# Patient Record
Sex: Female | Born: 1970 | Race: White | Hispanic: No | Marital: Married | Smoking: Never smoker
Health system: Southern US, Community
[De-identification: ages and names within clinical notes are randomized; demographics above are authoritative.]

## PROBLEM LIST (undated history)

## (undated) DIAGNOSIS — Z8489 Family history of other specified conditions: Secondary | ICD-10-CM

## (undated) DIAGNOSIS — Z87442 Personal history of urinary calculi: Secondary | ICD-10-CM

## (undated) DIAGNOSIS — I10 Essential (primary) hypertension: Secondary | ICD-10-CM

## (undated) HISTORY — PX: ENDOMETRIAL ABLATION W/ NOVASURE: SUR434

---

## 2001-01-14 ENCOUNTER — Other Ambulatory Visit: Admission: RE | Admit: 2001-01-14 | Discharge: 2001-01-14 | Payer: Self-pay | Admitting: *Deleted

## 2001-10-04 ENCOUNTER — Ambulatory Visit (HOSPITAL_COMMUNITY): Admission: RE | Admit: 2001-10-04 | Discharge: 2001-10-04 | Payer: Self-pay | Admitting: General Surgery

## 2015-05-02 ENCOUNTER — Other Ambulatory Visit: Payer: Self-pay | Admitting: Obstetrics and Gynecology

## 2015-05-02 DIAGNOSIS — R928 Other abnormal and inconclusive findings on diagnostic imaging of breast: Secondary | ICD-10-CM

## 2015-05-10 ENCOUNTER — Ambulatory Visit
Admission: RE | Admit: 2015-05-10 | Discharge: 2015-05-10 | Disposition: A | Discharge status: Home / Self Care | Payer: BC Managed Care – PPO | Source: Ambulatory Visit | Attending: Obstetrics and Gynecology | Admitting: Obstetrics and Gynecology

## 2015-05-10 DIAGNOSIS — R928 Other abnormal and inconclusive findings on diagnostic imaging of breast: Secondary | ICD-10-CM

## 2017-07-16 ENCOUNTER — Emergency Department (HOSPITAL_COMMUNITY): Payer: BC Managed Care – PPO

## 2017-07-16 ENCOUNTER — Encounter (HOSPITAL_COMMUNITY): Payer: Self-pay | Admitting: *Deleted

## 2017-07-16 ENCOUNTER — Other Ambulatory Visit: Payer: Self-pay

## 2017-07-16 ENCOUNTER — Emergency Department (HOSPITAL_COMMUNITY)
Admission: EM | Admit: 2017-07-16 | Discharge: 2017-07-17 | Disposition: A | Discharge status: Home / Self Care | Payer: BC Managed Care – PPO | Attending: Emergency Medicine | Admitting: Emergency Medicine

## 2017-07-16 DIAGNOSIS — K8020 Calculus of gallbladder without cholecystitis without obstruction: Secondary | ICD-10-CM | POA: Diagnosis not present

## 2017-07-16 DIAGNOSIS — M549 Dorsalgia, unspecified: Secondary | ICD-10-CM | POA: Insufficient documentation

## 2017-07-16 LAB — CBC
HEMATOCRIT: 39 % (ref 36.0–46.0)
Hemoglobin: 12.5 g/dL (ref 12.0–15.0)
MCH: 29.3 pg (ref 26.0–34.0)
MCHC: 32.1 g/dL (ref 30.0–36.0)
MCV: 91.3 fL (ref 78.0–100.0)
PLATELETS: 322 10*3/uL (ref 150–400)
RBC: 4.27 MIL/uL (ref 3.87–5.11)
RDW: 13.2 % (ref 11.5–15.5)
WBC: 17.8 10*3/uL — AB (ref 4.0–10.5)

## 2017-07-16 MED ORDER — GI COCKTAIL ~~LOC~~
30.0000 mL | Freq: Once | ORAL | Status: AC
  Administered 2017-07-16: 30 mL via ORAL
  Filled 2017-07-16: qty 30

## 2017-07-16 NOTE — ED Provider Notes (Signed)
[facility]Y [facility]PART[facility] Provider Note   CSN: [id number] Arrival date & time: 07/16/17  2203     History   Chief Complaint Chief Complaint  Patient presents with  . Back Pain    HPI Amy Dickerson is a 46 y.o. female.  Patient presents with sudden onset back pain onset while she was resting about 8:30 PM.  She reports pain across her entire upper back between her shoulder blades that lasted for about 30 minutes and then resolved.  After 30 minutes the pain returned and lasted for about another 30 minutes and radiated to her chest.  She is now pain-free.  She did not take anything for it.  Pain was associated with shortness of breath.  No abdominal pain, vomiting, fever, cough.  She now feels like she is breathing normally and feels back to baseline.  Denies any cardiac problems or lung problems.  Patient had this similar pain when she was pregnant about 11 years ago that was attributed to her gallbladder she has not had any symptoms since.  She denies any abdominal pain or pain with eating.  Denies any leg pain or leg swelling.  No recent long car trips or plane trips.  No history of blood clots.   The history is provided by the patient.    History reviewed. No pertinent past medical history.  There are no active problems to display for this patient.   Past Surgical History:  Procedure Laterality Date  . CESAREAN SECTION     x 2    OB History    No data available       Home Medications    Prior to Admission medications   Not on File    Family History History reviewed. No pertinent family history.  Social History Social History   Tobacco Use  . Smoking status: Never Smoker  . Smokeless tobacco: Never Used  Substance Use Topics  . Alcohol use: Yes    Frequency: Never    Comment: occasionally  . Drug use: No     Allergies   Patient has no known allergies.   Review of Systems Review of Systems  Constitutional: Negative for activity  change, appetite change and fever.  HENT: Negative for congestion and rhinorrhea.   Respiratory: Positive for chest tightness and shortness of breath.   Cardiovascular: Positive for chest pain.  Gastrointestinal: Negative for abdominal pain, nausea and vomiting.  Genitourinary: Negative for dysuria, hematuria, vaginal bleeding and vaginal discharge.  Musculoskeletal: Positive for back pain. Negative for arthralgias and myalgias.  Skin: Negative for rash.  Neurological: Negative for dizziness, weakness and light-headedness.   all other systems are negative except as noted in the HPI and PMH.     Physical Exam Updated Vital Signs BP (!) 145/92 (BP Location: Left Arm)   Pulse 100   Temp 98.6 F (37 C) (Oral)   Resp 19   Ht 5\' 5"  (1.651 m)   Wt 93.9 kg (207 lb)   LMP 07/08/2017   SpO2 98%   BMI 34.45 kg/m   Physical Exam  Constitutional: She is oriented to person, place, and time. She appears well-developed and well-nourished. No distress.  HENT:  Head: Normocephalic and atraumatic.  Mouth/Throat: Oropharynx is clear and moist. No oropharyngeal exudate.  Eyes: Conjunctivae and EOM are normal. Pupils are equal, round, and reactive to light.  Neck: Normal range of motion. Neck supple.  No meningismus.  Cardiovascular: Normal rate, regular rhythm, normal heart sounds and intact  distal pulses.  No murmur heard. Intact radial pulses and grip strengths  Pulmonary/Chest: Effort normal and breath sounds normal. No respiratory distress. She exhibits no tenderness.  Abdominal: Soft. There is no tenderness. There is no rebound and no guarding.  No right upper quadrant tenderness  Musculoskeletal: Normal range of motion. She exhibits no edema or tenderness.  The upper back appears normal and is nontender to palpation  Neurological: She is alert and oriented to person, place, and time. No cranial nerve deficit. She exhibits normal muscle tone. Coordination normal.   5/5 strength  throughout. CN 2-12 intact.Equal grip strength.   Skin: Skin is warm.  Psychiatric: She has a normal mood and affect. Her behavior is normal.  Nursing note and vitals reviewed.    ED Treatments / Results  Labs (all labs ordered are listed, but only abnormal results are displayed) Labs Reviewed  BASIC METABOLIC PANEL - Abnormal; Notable for the following components:      Result Value   Glucose, Bld 211 (*)    Creatinine, Ser 1.20 (*)    GFR calc non Af Amer 53 (*)    All other components within normal limits  CBC - Abnormal; Notable for the following components:   WBC 17.8 (*)    All other components within normal limits  D-DIMER, QUANTITATIVE (NOT AT [facility]) - Abnormal; Notable for the following components:   D-Dimer, Quant 0.54 (*)    All other components within normal limits  HEPATIC FUNCTION PANEL - Abnormal; Notable for the following components:   AST 100 (*)    All other components within normal limits  LIPASE, BLOOD  I-STAT TROPONIN, ED    EKG  EKG Interpretation  Date/Time:  Friday July 16 2017 22:37:12 EST Ventricular Rate:  84 PR Interval:  146 QRS Duration: 94 QT Interval:  392 QTC Calculation: 463 R Axis:   65 Text Interpretation:  Normal sinus rhythm Normal ECG No previous ECGs available Confirmed by Glynn Octave [phone]) on 07/16/2017 10:53:38 PM       Radiology Dg Chest 2 View  Result Date: 07/16/2017 CLINICAL DATA:  Upper back pain radiating to the chest. EXAM: CHEST  2 VIEW COMPARISON:  None. FINDINGS: The heart size and mediastinal contours are within normal limits. Both lungs are clear. The visualized skeletal structures are unremarkable. IMPRESSION: No active cardiopulmonary disease. Electronically Signed   By: Burman Nieves M.D.   On: 07/16/2017 23:28   Ct Angio Chest/abd/pel For Dissection W And/or Wo Contrast  Addendum Date: 07/17/2017   ADDENDUM REPORT: 07/17/2017 02:43 ADDENDUM: The timing of image acquisition was optimized for  assessment of the aorta rather than pulmonary arteries. There is no central pulmonary embolus or embolus within the lobar arteries. The appearance of the proximal segmental arteries is normal, but there is limited visualization of the distal segmental branches. There are no secondary findings that would suggest pulmonary embolus. Electronically Signed   By: Deatra Robinson M.D.   On: 07/17/2017 02:43   Result Date: 07/17/2017 CLINICAL DATA:  Upper back pain radiating to the chest EXAM: CT ANGIOGRAPHY CHEST, ABDOMEN AND PELVIS TECHNIQUE: Multidetector CT imaging through the chest, abdomen and pelvis was performed using the standard protocol during bolus administration of intravenous contrast. Multiplanar reconstructed images and MIPs were obtained and reviewed to evaluate the vascular anatomy. CONTRAST:  ISOVUE-370 IOPAMIDOL (ISOVUE-370) INJECTION 76% COMPARISON:  Chest radiograph 07/16/2017 FINDINGS: CTA CHEST FINDINGS Cardiovascular: Heart size is normal. There is no pericardial effusion. The course and caliber  of the thoracic aorta are normal. There is no atherosclerotic calcification. Precontrast imaging shows no intramural hematoma. There is no dissection or focal ulceration. There is a normal 3 vessel branching pattern of the aortic arch. The proximal arch vessels are widely patent. Mediastinum/Nodes: No mediastinal, hilar or axillary lymphadenopathy. The visualized thyroid and thoracic esophageal course are unremarkable. Lungs/Pleura: No pulmonary nodules or masses. No pleural effusion or pneumothorax. No focal airspace consolidation. No focal pleural abnormality. Musculoskeletal: No chest wall abnormality. No acute osseous abnormality. Review of the MIP images confirms the above findings. CTA ABDOMEN AND PELVIS FINDINGS VASCULAR Aorta: Normal caliber aorta without aneurysm, dissection, vasculitis or hemodynamically significant stenosis. There is calcific aortic atherosclerosis. Celiac: No aneurysm,  dissection or hemodynamically significant stenosis. Normal branching pattern. SMA: Widely patent without dissection or stenosis. Renals: Single renal arteries bilaterally. No aneurysm, dissection, stenosis or evidence of fibromuscular dysplasia. IMA: Patent without abnormality. Inflow: Minimal atherosclerotic calcification without any stenosis or other abnormality. Veins: Normal course and caliber of the major veins. Assessment is otherwise limited by the arterial dominant contrast phase. Review of the MIP images confirms the above findings. NON-VASCULAR Lower chest: No pulmonary nodules or pleural effusion. No visible pericardial effusion. Hepatobiliary: Normal hepatic contours and density. No visible biliary dilatation. Moderate gallbladder wall thickening versus large noncalcified gallstone. Pancreas: Normal contours without ductal dilatation. No peripancreatic fluid collection. Spleen: Normal arterial phase appearance of the spleen. Adrenals/Urinary Tract: --Adrenal glands: Normal. --Right kidney/ureter: No hydronephrosis or perinephric stranding. No nephrolithiasis. No obstructing ureteral stones. --Left kidney/ureter: No hydronephrosis or perinephric stranding. No nephrolithiasis. No obstructing ureteral stones. --Urinary bladder: Unremarkable. Stomach/Bowel: --Stomach/Duodenum: No hiatal hernia or other gastric abnormality. Normal duodenal course and caliber. --Small bowel: No dilatation or inflammation. --Colon: No focal abnormality. --Appendix: Normal. Lymphatic:  No abdominal or pelvic lymphadenopathy. Reproductive: Normal uterus and ovaries. Musculoskeletal. No bony spinal canal stenosis or focal osseous abnormality. Other: None. Review of the MIP images confirms the above findings. IMPRESSION: 1. No acute aortic syndrome. 2. Moderate gallbladder wall thickening versus large noncalcified gallstone. Right upper quadrant ultrasound is recommended. 3. Very mild calcific aortic atherosclerosis (ICD10-I70.0).  Electronically Signed: By: Deatra RobinsonKevin  Herman M.D. On: 07/17/2017 01:30    Procedures Procedures (including critical care time)  Medications Ordered in ED Medications  gi cocktail (Maalox,Lidocaine,Donnatal) (30 mLs Oral Given 07/16/17 2352)  iopamidol (ISOVUE-370) 76 % injection 100 mL (100 mLs Intravenous Contrast Given 07/17/17 0113)     Initial Impression / Assessment and Plan / ED Course  I have reviewed the triage vital signs and the nursing notes.  Pertinent labs & imaging results that were available during my care of the patient were reviewed by me and considered in my medical decision making (see chart for details).    Acute onset of upper back pain with shortness of breath, now resolved.  Patient is in no distress.  Breath sounds are equal.  EKG is nonischemic.  Low suspicion for ACS or aortic dissection.  Will obtain chest x-ray, labs including d-dimer to rule out pulmonary embolism.  Will evaluate gallbladder with LFTs and lipase.  Labs show nonspecific AST with leukocytosis.  Normal lipase.  Chest x-ray is negative.  Imaging negative for aortic dissection or pulmonary embolism. Initial troponin lost.  Second troponin at 3-hour mark is negative.  Low suspicion for ACS or pulmonary embolism.  CT shows thickened gallbladder wall and possible gallstone.  Patient with no right upper quadrant pain on exam. Ultrasound not available at this time tonight.  Upper  back pain has resolved.  We will arrange for outpatient ultrasound.  Patient to follow-up with surgery as needed for gallbladder pathology.  Return precautions discussed including worsening pain, fever, vomiting or any other concerns.  Final Clinical Impressions(s) / ED Diagnoses   Final diagnoses:  Upper back pain  Calculus of gallbladder without cholecystitis without obstruction    ED Discharge Orders    None       Geordie Nooney, Jeannett Senior, MD 07/17/17 [id number]

## 2017-07-16 NOTE — ED Triage Notes (Signed)
Pt c/o upper back pain that radiates around to her chest; pt states the pain started x 30 mins ago

## 2017-07-17 ENCOUNTER — Ambulatory Visit (HOSPITAL_COMMUNITY)
Admission: RE | Admit: 2017-07-17 | Discharge: 2017-07-17 | Disposition: A | Discharge status: Home / Self Care | Payer: BC Managed Care – PPO | Source: Ambulatory Visit | Attending: Emergency Medicine | Admitting: Emergency Medicine

## 2017-07-17 ENCOUNTER — Emergency Department (HOSPITAL_COMMUNITY): Payer: BC Managed Care – PPO

## 2017-07-17 LAB — BASIC METABOLIC PANEL
Anion gap: 10 (ref 5–15)
BUN: 16 mg/dL (ref 6–20)
CHLORIDE: 103 mmol/L (ref 101–111)
CO2: 23 mmol/L (ref 22–32)
CREATININE: 1.2 mg/dL — AB (ref 0.44–1.00)
Calcium: 9.5 mg/dL (ref 8.9–10.3)
GFR calc Af Amer: >60 mL/min (ref >60)
GFR calc non Af Amer: 53 mL/min — ABNORMAL LOW (ref >60)
Glucose, Bld: 211 mg/dL — ABNORMAL HIGH (ref 65–99)
Potassium: 3.5 mmol/L (ref 3.5–5.1)
Sodium: 136 mmol/L (ref 135–145)

## 2017-07-17 LAB — LIPASE, BLOOD: Lipase: 41 U/L (ref 11–51)

## 2017-07-17 LAB — HEPATIC FUNCTION PANEL
ALBUMIN: 4 g/dL (ref 3.5–5.0)
ALK PHOS: 112 U/L (ref 38–126)
ALT: 39 U/L (ref 14–54)
AST: 100 U/L — AB (ref 15–41)
BILIRUBIN TOTAL: 0.4 mg/dL (ref 0.3–1.2)
Bilirubin, Direct: 0.1 mg/dL (ref 0.1–0.5)
Indirect Bilirubin: 0.3 mg/dL (ref 0.3–0.9)
Total Protein: 7.9 g/dL (ref 6.5–8.1)

## 2017-07-17 LAB — I-STAT TROPONIN, ED: Troponin i, poc: 0 ng/mL (ref 0.00–0.08)

## 2017-07-17 LAB — D-DIMER, QUANTITATIVE: D-Dimer, Quant: 0.54 ug/mL-FEU — ABNORMAL HIGH (ref 0.00–0.50)

## 2017-07-17 MED ORDER — HYDROCODONE-ACETAMINOPHEN 5-325 MG PO TABS: 1.0000 | ORAL_TABLET | ORAL | 0 refills | Status: DC | PRN

## 2017-07-17 MED ORDER — ONDANSETRON 4 MG PO TBDP: 4.0000 mg | ORAL_TABLET | Freq: Three times a day (TID) | ORAL | 0 refills | Status: AC | PRN

## 2017-07-17 MED ORDER — IOPAMIDOL (ISOVUE-370) INJECTION 76%
100.0000 mL | Freq: Once | INTRAVENOUS | Status: AC | PRN
  Administered 2017-07-17: 100 mL via INTRAVENOUS

## 2017-07-17 NOTE — ED Notes (Signed)
Troponin 1 collected 07/16/17 1111 with sample processed by T Talbot.  Results not available.  Called lab to attempt add-on with lab reporting sample cannot be added on.  Troponin 1 redrawn and processed I-stat by Buzzy HanBrenda Norman.

## 2017-07-17 NOTE — ED Notes (Addendum)
AVS and prescriptions reviewed with verbalized understandings given by pt and pt spouse.  PIV removed with hemostasis achieved.

## 2017-07-17 NOTE — ED Provider Notes (Signed)
Patient aware of ultrasound findings, states that she feels better. She has appropriate follow-up information to contact our general surgeon in 2 days, and given her improved condition, this is appropriate.   Gerhard MunchLockwood, Josuel Koeppen, MD 07/17/17 1149

## 2017-07-17 NOTE — Discharge Instructions (Signed)
There is no evidence of heart attack or blood clot in the lung.  The CT scan shows a possible gallstone which may be the explanation for your upper back pain.  You should follow-up to have an ultrasound of your gallbladder by calling the number provided.  Return to the ED if you develop worsening pain, chest pain, trouble breathing, fever, vomiting or any other concerns.

## 2017-07-27 ENCOUNTER — Ambulatory Visit: Payer: BC Managed Care – PPO | Admitting: General Surgery

## 2017-07-27 ENCOUNTER — Encounter: Payer: Self-pay | Admitting: General Surgery

## 2017-07-27 VITALS — BP 141/93 | HR 95 | Temp 99.6°F | Resp 18 | Ht 65.0 in | Wt 205.0 lb

## 2017-07-27 DIAGNOSIS — K8020 Calculus of gallbladder without cholecystitis without obstruction: Secondary | ICD-10-CM | POA: Diagnosis not present

## 2017-07-27 NOTE — H&P (Signed)
[facility] History and Physical  Reason for Referral: Cholelithiasis  Referring Physician: Jake Samples, PA-C   Chief Complaint    Abdominal Pain      Amy Dickerson is a 46 y.o. female.  HPI: Ms. Amy Dickerson is a 46yo female with a history of back pain that caused her to go to the Ed where she was evaluated and found to have gallstones. She says that the pain started after eating Thanksgiving meal and leftovers. She reports having a similar episode maybe 11 years ago, and says that since then she has been relatively well.  She had some nausea but no vomiting. She did have a leukocytosis but report a cold at the same time as the gallbladder episode.  She denies any fevers or chills or continued RUQ pain. The pain improved on its own, and has not reoccurred in the last few days.   History reviewed. No pertinent past medical history.  Past Surgical History:  Procedure Laterality Date  . CESAREAN SECTION     x 2    Family History  Problem Relation Age of Onset  . Heart disease Mother   . Pulmonary fibrosis Mother   . Stroke Father   . Multiple myeloma Father     Social History   Tobacco Use  . Smoking status: Never Smoker  . Smokeless tobacco: Never Used  Substance Use Topics  . Alcohol use: Yes    Frequency: Never    Comment: occasionally  . Drug use: No    Medications: I have reviewed the patient's current medications. Prior to Admission:  (Not in a hospital admission) Scheduled: Continuous: PRN: Allergies as of 07/27/2017   No Known Allergies     Medication List        Accurate as of 07/27/17  9:55 AM. Always use your most recent med list.          HYDROcodone-acetaminophen 5-325 MG tablet Commonly known as:  NORCO/VICODIN Take 1 tablet by mouth every 4 (four) hours as needed.   ondansetron 4 MG disintegrating tablet Commonly known as:  ZOFRAN ODT Take 1 tablet (4 mg total) by mouth every 8 (eight) hours as needed for nausea  or vomiting.        ROS:  A comprehensive review of systems was negative except for: Gastrointestinal: positive for ocassional RUQ pain with food  Blood pressure (!) 141/93, pulse 95, temperature 99.6 F (37.6 C), resp. rate 18, height _0  (1.651 m), weight 205 lb (93 kg), last menstrual period 07/08/2017. Physical Exam  Constitutional: She is oriented to person, place, and time and well-developed, well-nourished, and in no distress.  HENT:  Head: Normocephalic.  Eyes: Pupils are equal, round, and reactive to light.  Neck: Normal range of motion.  Cardiovascular: Normal rate and regular rhythm.  Pulmonary/Chest: Effort normal and breath sounds normal.  Abdominal: Soft. She exhibits no distension. There is no tenderness. There is no rebound and no guarding.  Musculoskeletal: Normal range of motion. She exhibits no edema.  Neurological: She is alert and oriented to person, place, and time.  Skin: Skin is warm and dry.  Psychiatric: Mood, memory, affect and judgment normal.  Vitals reviewed.   Results: Personally reviewed imaging- contracted gallbladder with stone, large stone  CTA chest/ abd 11/24 - IMPRESSION: 1. No acute aortic syndrome. 2. Moderate gallbladder wall thickening versus large noncalcified gallstone. Right upper quadrant ultrasound is recommended. 3. Very mild calcific aortic atherosclerosis (ICD10-I70.0).  [place] RUQ 11/24- IMPRESSION: Cholelithiasis  with a large stone measuring up to 3.6 cm and sludge. No sonographic evidence for cholecystitis.  Assessment & Plan:  Amy Dickerson is a 46 y.o. female with cholelithiasis on imaging, and biliary colic symptoms. She did have a leukocytosis on the ED visit but normal LFTs.  Had a cough/ cold at the same time.  -OR for laparoscopic cholecystectomy, going out of town in the upcoming weeks and plan for 08/27/17   PLAN: I counseled the patient about the indication, risks and benefits of laparoscopic cholecystectomy.   She understands there is a very small chance for bleeding, infection, injury to normal structures (including common bile duct), conversion to open surgery, persistent symptoms, evolution of postcholecystectomy diarrhea, need for secondary interventions, anesthesia reaction, cardiopulmonary issues and other risks not specifically detailed here. I described the expected recovery, the plan for follow-up and the restrictions during the recovery phase.  All questions were answered.    Hart Haas C Mirna Sutcliffe 07/27/2017, 9:55 AM       

## 2017-07-27 NOTE — Patient Instructions (Signed)
Laparoscopic Cholecystectomy Laparoscopic cholecystectomy is surgery to remove the gallbladder. The gallbladder is a pear-shaped organ that lies beneath the liver on the right side of the body. The gallbladder stores bile, which is a fluid that helps the body to digest fats. Cholecystectomy is often done for inflammation of the gallbladder (cholecystitis). This condition is usually caused by a buildup of gallstones (cholelithiasis) in the gallbladder. Gallstones can block the flow of bile, which can result in inflammation and pain. In severe cases, emergency surgery may be required. This procedure is done though small incisions in your abdomen (laparoscopic surgery). A thin scope with a camera (laparoscope) is inserted through one incision. Thin surgical instruments are inserted through the other incisions. In some cases, a laparoscopic procedure may be turned into a type of surgery that is done through a larger incision (open surgery). Tell a health care provider about:  Any allergies you have.  All medicines you are taking, including vitamins, herbs, eye drops, creams, and over-the-counter medicines.  Any problems you or family members have had with anesthetic medicines.  Any blood disorders you have.  Any surgeries you have had.  Any medical conditions you have.  Whether you are pregnant or may be pregnant. What are the risks? Generally, this is a safe procedure. However, problems may occur, including:  Infection.  Bleeding.  Allergic reactions to medicines.  Damage to other structures or organs.  A stone remaining in the common bile duct. The common bile duct carries bile from the gallbladder into the small intestine.  A bile leak from the cyst duct that is clipped when your gallbladder is removed.  What happens before the procedure? Staying hydrated Follow instructions from your health care provider about hydration, which may include:  Up to 2 hours before the procedure -  you may continue to drink clear liquids, such as water, clear fruit juice, black coffee, and plain tea.  Eating and drinking restrictions Follow instructions from your health care provider about eating and drinking, which may include:  8 hours before the procedure - stop eating heavy meals or foods such as meat, fried foods, or fatty foods.  6 hours before the procedure - stop eating light meals or foods, such as toast or cereal.  6 hours before the procedure - stop drinking milk or drinks that contain milk.  2 hours before the procedure - stop drinking clear liquids.  Medicines  Ask your health care provider about: ? Changing or stopping your regular medicines. This is especially important if you are taking diabetes medicines or blood thinners. ? Taking medicines such as aspirin and ibuprofen. These medicines can thin your blood. Do not take these medicines before your procedure if your health care provider instructs you not to.  You may be given antibiotic medicine to help prevent infection. General instructions  Let your health care provider know if you develop a cold or an infection before surgery.  Plan to have someone take you home from the hospital or clinic.  Ask your health care provider how your surgical site will be marked or identified. What happens during the procedure?  To reduce your risk of infection: ? Your health care team will wash or sanitize their hands. ? Your skin will be washed with soap. ? Hair may be removed from the surgical area.  An IV tube may be inserted into one of your veins.  You will be given one or more of the following: ? A medicine to help you relax (sedative). ?   A medicine to make you fall asleep (general anesthetic).  A breathing tube will be placed in your mouth.  Your surgeon will make several small cuts (incisions) in your abdomen.  The laparoscope will be inserted through one of the small incisions. The camera on the laparoscope  will send images to a TV screen (monitor) in the operating room. This lets your surgeon see inside your abdomen.  Air-like gas will be pumped into your abdomen. This will expand your abdomen to give the surgeon more room to perform the surgery.  Other tools that are needed for the procedure will be inserted through the other incisions. The gallbladder will be removed through one of the incisions.  Your common bile duct may be examined. If stones are found in the common bile duct, they may be removed.  After your gallbladder has been removed, the incisions will be closed with stitches (sutures), staples, or skin glue.  Your incisions may be covered with a bandage (dressing). The procedure may vary among health care providers and hospitals. What happens after the procedure?  Your blood pressure, heart rate, breathing rate, and blood oxygen level will be monitored until the medicines you were given have worn off.  You will be given medicines as needed to control your pain.  Do not drive for 24 hours if you were given a sedative. This information is not intended to replace advice given to you by your health care provider. Make sure you discuss any questions you have with your health care provider. Document Released: 08/10/2005 Document Revised: 03/01/2016 Document Reviewed: 01/27/2016 Elsevier Interactive Patient Education  2018 [employer] Cholelithiasis Cholelithiasis is also called "gallstones." It is a kind of gallbladder disease. The gallbladder is an organ that stores a liquid (bile) that helps you digest fat. Gallstones may not cause symptoms (may be silent gallstones) until they cause a blockage, and then they can cause pain (gallbladder attack). Follow these instructions at home:  Take over-the-counter and prescription medicines only as told by your doctor.  Stay at a healthy weight.  Eat healthy foods. This includes: ? Eating fewer fatty foods, like fried foods. ? Eating  fewer refined carbs (refined carbohydrates). Refined carbs are breads and grains that are highly processed, like white bread and white rice. Instead, choose whole grains like whole-wheat bread and brown rice. ? Eating more fiber. Almonds, fresh fruit, and beans are healthy sources of fiber.  Keep all follow-up visits as told by your doctor. This is important. Contact a doctor if:  You have sudden pain in the upper right side of your belly (abdomen). Pain might spread to your right shoulder or your chest. This may be a sign of a gallbladder attack.  You feel sick to your stomach (are nauseous).  You throw up (vomit).  You have been diagnosed with gallstones that have no symptoms and you get: ? Belly pain. ? Discomfort, burning, or fullness in the upper part of your belly (indigestion). Get help right away if:  You have sudden pain in the upper right side of your belly, and it lasts for more than 2 hours.  You have belly pain that lasts for more than 5 hours.  You have a fever or chills.  You keep feeling sick to your stomach or you keep throwing up.  Your skin or the whites of your eyes turn yellow (jaundice).  You have dark-colored pee (urine).  You have light-colored poop (stool). Summary  Cholelithiasis is also called "gallstones."  The gallbladder   is an organ that stores a liquid (bile) that helps you digest fat.  Silent gallstones are gallstones that do not cause symptoms.  A gallbladder attack may cause sudden pain in the upper right side of your belly. Pain might spread to your right shoulder or your chest. If this happens, contact your doctor.  If you have sudden pain in the upper right side of your belly that lasts for more than 2 hours, get help right away. This information is not intended to replace advice given to you by your health care provider. Make sure you discuss any questions you have with your health care provider. Document Released: 01/27/2008 Document  Revised: 04/26/2016 Document Reviewed: 04/26/2016 Elsevier Interactive Patient Education  2017 [employer]  

## 2017-07-27 NOTE — Progress Notes (Signed)
[facility] History and Physical  Reason for Referral: Cholelithiasis  Referring Physician: Jake Samples, PA-C   Chief Complaint    Abdominal Pain      Amy Dickerson is a 46 y.o. female.  HPI: Amy Dickerson is a 46yo female with a history of back pain that caused her to go to the Ed where she was evaluated and found to have gallstones. She says that the pain started after eating Thanksgiving meal and leftovers. She reports having a similar episode maybe 11 years ago, and says that since then she has been relatively well.  She had some nausea but no vomiting. She did have a leukocytosis but report a cold at the same time as the gallbladder episode.  She denies any fevers or chills or continued RUQ pain. The pain improved on its own, and has not reoccurred in the last few days.   History reviewed. No pertinent past medical history.  Past Surgical History:  Procedure Laterality Date  . CESAREAN SECTION     x 2    Family History  Problem Relation Age of Onset  . Heart disease Mother   . Pulmonary fibrosis Mother   . Stroke Father   . Multiple myeloma Father     Social History   Tobacco Use  . Smoking status: Never Smoker  . Smokeless tobacco: Never Used  Substance Use Topics  . Alcohol use: Yes    Frequency: Never    Comment: occasionally  . Drug use: No    Medications: I have reviewed the patient's current medications. Prior to Admission:  (Not in a hospital admission) Scheduled: Continuous: PRN: Allergies as of 07/27/2017   No Known Allergies     Medication List        Accurate as of 07/27/17  9:55 AM. Always use your most recent med list.          HYDROcodone-acetaminophen 5-325 MG tablet Commonly known as:  NORCO/VICODIN Take 1 tablet by mouth every 4 (four) hours as needed.   ondansetron 4 MG disintegrating tablet Commonly known as:  ZOFRAN ODT Take 1 tablet (4 mg total) by mouth every 8 (eight) hours as needed for nausea  or vomiting.        ROS:  A comprehensive review of systems was negative except for: Gastrointestinal: positive for ocassional RUQ pain with food  Blood pressure (!) 141/93, pulse 95, temperature 99.6 F (37.6 C), resp. rate 18, height _0  (1.651 m), weight 205 lb (93 kg), last menstrual period 07/08/2017. Physical Exam  Constitutional: She is oriented to person, place, and time and well-developed, well-nourished, and in no distress.  HENT:  Head: Normocephalic.  Eyes: Pupils are equal, round, and reactive to light.  Neck: Normal range of motion.  Cardiovascular: Normal rate and regular rhythm.  Pulmonary/Chest: Effort normal and breath sounds normal.  Abdominal: Soft. She exhibits no distension. There is no tenderness. There is no rebound and no guarding.  Musculoskeletal: Normal range of motion. She exhibits no edema.  Neurological: She is alert and oriented to person, place, and time.  Skin: Skin is warm and dry.  Psychiatric: Mood, memory, affect and judgment normal.  Vitals reviewed.   Results: Personally reviewed imaging- contracted gallbladder with stone, large stone  CTA chest/ abd 11/24 - IMPRESSION: 1. No acute aortic syndrome. 2. Moderate gallbladder wall thickening versus large noncalcified gallstone. Right upper quadrant ultrasound is recommended. 3. Very mild calcific aortic atherosclerosis (ICD10-I70.0).  [place] RUQ 11/24- IMPRESSION: Cholelithiasis  with a large stone measuring up to 3.6 cm and sludge. No sonographic evidence for cholecystitis.  Assessment & Plan:  Amy Dickerson is a 46 y.o. female with cholelithiasis on imaging, and biliary colic symptoms. She did have a leukocytosis on the ED visit but normal LFTs.  Had a cough/ cold at the same time.  -OR for laparoscopic cholecystectomy, going out of town in the upcoming weeks and plan for 08/27/17   PLAN: I counseled the patient about the indication, risks and benefits of laparoscopic cholecystectomy.   She understands there is a very small chance for bleeding, infection, injury to normal structures (including common bile duct), conversion to open surgery, persistent symptoms, evolution of postcholecystectomy diarrhea, need for secondary interventions, anesthesia reaction, cardiopulmonary issues and other risks not specifically detailed here. I described the expected recovery, the plan for follow-up and the restrictions during the recovery phase.  All questions were answered.    Virl Cagey 07/27/2017, 9:55 AM

## 2017-08-18 NOTE — Patient Instructions (Signed)
Amy DonningRebecca M Dickerson  08/18/2017     @[email or web address] @   Your procedure is scheduled on  08/27/2017   [facility] at  [facility]   [facility].M.  Call this number if you have problems the morning of surgery:  [phone]   Remember:  Do not eat food or drink liquids after midnight.  Take these medicines the morning of surgery with A SIP OF WATER  zofran.   Do not wear jewelry, make-up or nail polish.  Do not wear lotions, powders, or perfumes, or deodorant.  Do not shave 48 hours prior to surgery.  Men may shave face and neck.  Do not bring valuables to the hospital.  [facility] is not responsible for any belongings or valuables.  Contacts, dentures or bridgework may not be worn into surgery.  Leave your suitcase in the car.  After surgery it may be brought to your room.  For patients admitted to the hospital, discharge time will be determined by your treatment team.  Patients discharged the day of surgery will not be allowed to drive home.   Name and phone number of your driver:   family Special instructions:  None  Please read over the following fact sheets that you were given. Anesthesia Post-op Instructions and Care and Recovery After Surgery       Open Cholecystectomy Open cholecystectomy is surgery to remove the gallbladder. The gallbladder is a pear-shaped organ that lies beneath the liver on the right side of the body. The gallbladder stores bile, which is a fluid that helps the body to digest fats. Cholecystectomy is often done for inflammation of the gallbladder (cholecystitis). This condition is usually caused by a buildup of gallstones (cholelithiasis) in the gallbladder. Gallstones can block the flow of bile, which can result in inflammation and pain. In severe cases, emergency surgery may be required. Tell a health care provider about:  Any allergies you have.  All medicines you are taking, including vitamins, herbs, eye drops, creams, and  over-the-counter medicines.  Any problems you or family members have had with anesthetic medicines.  Any blood disorders you have.  Any surgeries you have had.  Any medical conditions you have.  Whether you are pregnant or may be pregnant. What are the risks? Generally, this is a safe procedure. However, problems may occur, including:  Infection.  Bleeding.  Allergic reactions to medicines.  Damage to other structures or organs.  A stone remaining in the common bile duct. The common bile duct carries bile from the gallbladder into the small intestine.  A bile leak from the cyst duct that is clipped when your gallbladder is removed.  What happens before the procedure? Staying hydrated Follow instructions from your health care provider about hydration, which may include:  Up to 2 hours before the procedure - you may continue to drink clear liquids, such as water, clear fruit juice, black coffee, and plain tea.  Eating and drinking restrictions Follow instructions from your health care provider about eating and drinking, which may include:  8 hours before the procedure - stop eating heavy meals or foods such as meat, fried foods, or fatty foods.  6 hours before the procedure - stop eating light meals or foods, such as toast or cereal.  6 hours before the procedure - stop drinking milk or drinks that contain milk.  2 hours before the procedure - stop drinking clear liquids.  Medicines  Ask your health care provider about: ? Changing or stopping your regular medicines. This is especially important if you are taking diabetes medicines or blood thinners. ? Taking medicines such as aspirin and ibuprofen. These medicines can thin your blood. Do not take these medicines before your procedure if your health care provider instructs you not to.  You may be given antibiotic medicine to help prevent infection. General instructions  Let your health care provider know if you  develop a cold or an infection before surgery.  Plan to have someone take you home from the hospital or clinic.  Ask your health care provider how your surgical site will be marked or identified. What happens during the procedure?  To reduce your risk of infection: ? Your health care team will wash or sanitize their hands. ? Your skin will be washed with soap. ? Hair may be removed from the surgical area.  An IV tube may be inserted into one of your veins.  You will be given one or more of the following: ? A medicine to help you relax (sedative). ? A medicine to make you fall asleep (general anesthetic).  A breathing tube will be placed in your mouth.  Your surgeon will make a cut (incision) in the upper abdomen to access your gallbladder.  Your gallbladder will be removed.  Your common bile duct may be examined. If stones are found in the common bile duct, they may be removed.  After your gallbladder has been removed, the incisions will be closed with stitches (sutures), skin glue, or staples.  Your incision will be covered with a bandage (dressing). The procedure may vary among health care providers and hospitals. What happens after the procedure?  Your blood pressure, heart rate, breathing rate, and blood oxygen level will be monitored until the medicines you were given have worn off.  You will be given medicines as needed to control your pain.  Do not drive for 24 hours if you were given a sedative. This information is not intended to replace advice given to you by your health care provider. Make sure you discuss any questions you have with your health care provider. Document Released: 05/02/2002 Document Revised: 02/29/2016 Document Reviewed: 01/27/2016 Elsevier Interactive Patient Education  2018 [employer].  Open Cholecystectomy, Care After This sheet gives you information about how to care for yourself after your procedure. Your health care provider may also give  you more specific instructions. If you have problems or questions, contact your health care provider. What can I expect after the procedure? After the procedure, it is common to have:  Pain at your incision site. You will be given medicines to control this pain.  Mild nausea or vomiting.  Follow these instructions at home: Incision care   Follow instructions from your health care provider about how to take care of your incision. Make sure you: ? Wash your hands with soap and water before you change your bandage (dressing). If soap and water are not available, use hand sanitizer. ? Change your dressing as told by your health care provider. ? Leave stitches (sutures), skin glue, or adhesive strips in place. These skin closures may need to be in place for 2 weeks or longer. If adhesive strip edges start to loosen and curl up, you may trim the loose edges. Do not remove adhesive strips completely unless your health care provider tells you to do that.  Do not take baths, swim, or use a hot tub until your health  care provider approves. Ask your health care provider if you can take showers. You may only be allowed to take sponge baths for bathing.  Check your incision area every day for signs of infection. Check for: ? More redness, swelling, or pain. ? More fluid or blood. ? Warmth. ? Pus or a bad smell. Activity  Do not drive or use heavy machinery while taking prescription pain medicine.  Do not lift anything that is heavier than 10 lb (4.5 kg) until your health care provider approves.  Do not play contact sports until your health care provider approves.  Do not drive for 24 hours if you were given a medicine to help you relax (sedative).  Rest as needed. Do not return to work or school until your health care provider approves. General instructions  Take over-the-counter and prescription medicines only as told by your health care provider.  To prevent or treat constipation while  you are taking prescription pain medicine, your health care provider may recommend that you: ? Drink enough fluid to keep your urine clear or pale yellow. ? Take over-the-counter or prescription medicines. ? Eat foods that are high in fiber, such as fresh fruits and vegetables, whole grains, and beans. ? Limit foods that are high in fat and processed sugars, such as fried and sweet foods. Contact a health care provider if:  You develop a rash.  You have more redness, swelling, or pain around your incision.  You have more fluid or blood coming from your incision.  Your incision feels warm to the touch.  You have pus or a bad smell coming from your incision.  You have a fever.  Your incision breaks open. Get help right away if:  You have trouble breathing.  You have chest pain.  You have increasing pain in your shoulders.  You faint or feel dizzy when you stand.  You have severe pain in your abdomen.  You have nausea or vomiting that lasts for more than one day.  You have leg pain. This information is not intended to replace advice given to you by your health care provider. Make sure you discuss any questions you have with your health care provider. Document Released: 11/26/2003 Document Revised: 02/29/2016 Document Reviewed: 01/27/2016 Elsevier Interactive Patient Education  2018 [employer].  Laparoscopic Cholecystectomy Laparoscopic cholecystectomy is surgery to remove the gallbladder. The gallbladder is a pear-shaped organ that lies beneath the liver on the right side of the body. The gallbladder stores bile, which is a fluid that helps the body to digest fats. Cholecystectomy is often done for inflammation of the gallbladder (cholecystitis). This condition is usually caused by a buildup of gallstones (cholelithiasis) in the gallbladder. Gallstones can block the flow of bile, which can result in inflammation and pain. In severe cases, emergency surgery may be  required. This procedure is done though small incisions in your abdomen (laparoscopic surgery). A thin scope with a camera (laparoscope) is inserted through one incision. Thin surgical instruments are inserted through the other incisions. In some cases, a laparoscopic procedure may be turned into a type of surgery that is done through a larger incision (open surgery). Tell a health care provider about:  Any allergies you have.  All medicines you are taking, including vitamins, herbs, eye drops, creams, and over-the-counter medicines.  Any problems you or family members have had with anesthetic medicines.  Any blood disorders you have.  Any surgeries you have had.  Any medical conditions you have.  Whether you  are pregnant or may be pregnant. What are the risks? Generally, this is a safe procedure. However, problems may occur, including:  Infection.  Bleeding.  Allergic reactions to medicines.  Damage to other structures or organs.  A stone remaining in the common bile duct. The common bile duct carries bile from the gallbladder into the small intestine.  A bile leak from the cyst duct that is clipped when your gallbladder is removed.  What happens before the procedure? Staying hydrated Follow instructions from your health care provider about hydration, which may include:  Up to 2 hours before the procedure - you may continue to drink clear liquids, such as water, clear fruit juice, black coffee, and plain tea.  Eating and drinking restrictions Follow instructions from your health care provider about eating and drinking, which may include:  8 hours before the procedure - stop eating heavy meals or foods such as meat, fried foods, or fatty foods.  6 hours before the procedure - stop eating light meals or foods, such as toast or cereal.  6 hours before the procedure - stop drinking milk or drinks that contain milk.  2 hours before the procedure - stop drinking clear  liquids.  Medicines  Ask your health care provider about: ? Changing or stopping your regular medicines. This is especially important if you are taking diabetes medicines or blood thinners. ? Taking medicines such as aspirin and ibuprofen. These medicines can thin your blood. Do not take these medicines before your procedure if your health care provider instructs you not to.  You may be given antibiotic medicine to help prevent infection. General instructions  Let your health care provider know if you develop a cold or an infection before surgery.  Plan to have someone take you home from the hospital or clinic.  Ask your health care provider how your surgical site will be marked or identified. What happens during the procedure?  To reduce your risk of infection: ? Your health care team will wash or sanitize their hands. ? Your skin will be washed with soap. ? Hair may be removed from the surgical area.  An IV tube may be inserted into one of your veins.  You will be given one or more of the following: ? A medicine to help you relax (sedative). ? A medicine to make you fall asleep (general anesthetic).  A breathing tube will be placed in your mouth.  Your surgeon will make several small cuts (incisions) in your abdomen.  The laparoscope will be inserted through one of the small incisions. The camera on the laparoscope will send images to a TV screen (monitor) in the operating room. This lets your surgeon see inside your abdomen.  Air-like gas will be pumped into your abdomen. This will expand your abdomen to give the surgeon more room to perform the surgery.  Other tools that are needed for the procedure will be inserted through the other incisions. The gallbladder will be removed through one of the incisions.  Your common bile duct may be examined. If stones are found in the common bile duct, they may be removed.  After your gallbladder has been removed, the incisions will be  closed with stitches (sutures), staples, or skin glue.  Your incisions may be covered with a bandage (dressing). The procedure may vary among health care providers and hospitals. What happens after the procedure?  Your blood pressure, heart rate, breathing rate, and blood oxygen level will be monitored until the medicines you  were given have worn off.  You will be given medicines as needed to control your pain.  Do not drive for 24 hours if you were given a sedative. This information is not intended to replace advice given to you by your health care provider. Make sure you discuss any questions you have with your health care provider. Document Released: 08/10/2005 Document Revised: 03/01/2016 Document Reviewed: 01/27/2016 Elsevier Interactive Patient Education  2018 [employer].  Laparoscopic Cholecystectomy, Care After This sheet gives you information about how to care for yourself after your procedure. Your doctor may also give you more specific instructions. If you have problems or questions, contact your doctor. Follow these instructions at home: Care for cuts from surgery (incisions)   Follow instructions from your doctor about how to take care of your cuts from surgery. Make sure you: ? Wash your hands with soap and water before you change your bandage (dressing). If you cannot use soap and water, use hand sanitizer. ? Change your bandage as told by your doctor. ? Leave stitches (sutures), skin glue, or skin tape (adhesive) strips in place. They may need to stay in place for 2 weeks or longer. If tape strips get loose and curl up, you may trim the loose edges. Do not remove tape strips completely unless your doctor says it is okay.  Do not take baths, swim, or use a hot tub until your doctor says it is okay. Ask your doctor if you can take showers. You may only be allowed to take sponge baths for bathing.  Check your surgical cut area every day for signs of infection. Check  for: ? More redness, swelling, or pain. ? More fluid or blood. ? Warmth. ? Pus or a bad smell. Activity  Do not drive or use heavy machinery while taking prescription pain medicine.  Do not lift anything that is heavier than 10 lb (4.5 kg) until your doctor says it is okay.  Do not play contact sports until your doctor says it is okay.  Do not drive for 24 hours if you were given a medicine to help you relax (sedative).  Rest as needed. Do not return to work or school until your doctor says it is okay. General instructions  Take over-the-counter and prescription medicines only as told by your doctor.  To prevent or treat constipation while you are taking prescription pain medicine, your doctor may recommend that you: ? Drink enough fluid to keep your pee (urine) clear or pale yellow. ? Take over-the-counter or prescription medicines. ? Eat foods that are high in fiber, such as fresh fruits and vegetables, whole grains, and beans. ? Limit foods that are high in fat and processed sugars, such as fried and sweet foods. Contact a doctor if:  You develop a rash.  You have more redness, swelling, or pain around your surgical cuts.  You have more fluid or blood coming from your surgical cuts.  Your surgical cuts feel warm to the touch.  You have pus or a bad smell coming from your surgical cuts.  You have a fever.  One or more of your surgical cuts breaks open. Get help right away if:  You have trouble breathing.  You have chest pain.  You have pain that is getting worse in your shoulders.  You faint or feel dizzy when you stand.  You have very bad pain in your belly (abdomen).  You are sick to your stomach (nauseous) for more than one day.  You  have throwing up (vomiting) that lasts for more than one day.  You have leg pain. This information is not intended to replace advice given to you by your health care provider. Make sure you discuss any questions you have with  your health care provider. Document Released: 05/19/2008 Document Revised: 02/29/2016 Document Reviewed: 01/27/2016 Elsevier Interactive Patient Education  2018 [employer].  General Anesthesia, Adult General anesthesia is the use of medicines to make a person "go to sleep" (be unconscious) for a medical procedure. General anesthesia is often recommended when a procedure:  Is long.  Requires you to be still or in an unusual position.  Is major and can cause you to lose blood.  Is impossible to do without general anesthesia.  The medicines used for general anesthesia are called general anesthetics. In addition to making you sleep, the medicines:  Prevent pain.  Control your blood pressure.  Relax your muscles.  Tell a health care provider about:  Any allergies you have.  All medicines you are taking, including vitamins, herbs, eye drops, creams, and over-the-counter medicines.  Any problems you or family members have had with anesthetic medicines.  Types of anesthetics you have had in the past.  Any bleeding disorders you have.  Any surgeries you have had.  Any medical conditions you have.  Any history of heart or lung conditions, such as heart failure, sleep apnea, or chronic obstructive pulmonary disease (COPD).  Whether you are pregnant or may be pregnant.  Whether you use tobacco, alcohol, marijuana, or street drugs.  Any history of Financial planner.  Any history of depression or anxiety. What are the risks? Generally, this is a safe procedure. However, problems may occur, including:  Allergic reaction to anesthetics.  Lung and heart problems.  Inhaling food or liquids from your stomach into your lungs (aspiration).  Injury to nerves.  Waking up during your procedure and being unable to move (rare).  Extreme agitation or a state of mental confusion (delirium) when you wake up from the anesthetic.  Air in the bloodstream, which can lead to  stroke.  These problems are more likely to develop if you are having a major surgery or if you have an advanced medical condition. You can prevent some of these complications by answering all of your health care provider's questions thoroughly and by following all pre-procedure instructions. General anesthesia can cause side effects, including:  Nausea or vomiting  A sore throat from the breathing tube.  Feeling cold or shivery.  Feeling tired, washed out, or achy.  Sleepiness or drowsiness.  Confusion or agitation.  What happens before the procedure? Staying hydrated Follow instructions from your health care provider about hydration, which may include:  Up to 2 hours before the procedure - you may continue to drink clear liquids, such as water, clear fruit juice, black coffee, and plain tea.  Eating and drinking restrictions Follow instructions from your health care provider about eating and drinking, which may include:  8 hours before the procedure - stop eating heavy meals or foods such as meat, fried foods, or fatty foods.  6 hours before the procedure - stop eating light meals or foods, such as toast or cereal.  6 hours before the procedure - stop drinking milk or drinks that contain milk.  2 hours before the procedure - stop drinking clear liquids.  Medicines  Ask your health care provider about: ? Changing or stopping your regular medicines. This is especially important if you are taking diabetes medicines  or blood thinners. ? Taking medicines such as aspirin and ibuprofen. These medicines can thin your blood. Do not take these medicines before your procedure if your health care provider instructs you not to. ? Taking new dietary supplements or medicines. Do not take these during the week before your procedure unless your health care provider approves them.  If you are told to take a medicine or to continue taking a medicine on the day of the procedure, take the  medicine with sips of water. General instructions   Ask if you will be going home the same day, the following day, or after a longer hospital stay. ? Plan to have someone take you home. ? Plan to have someone stay with you for the first 24 hours after you leave the hospital or clinic.  For 3-6 weeks before the procedure, try not to use any tobacco products, such as cigarettes, chewing tobacco, and e-cigarettes.  You may brush your teeth on the morning of the procedure, but make sure to spit out the toothpaste. What happens during the procedure?  You will be given anesthetics through a mask and through an IV tube in one of your veins.  You may receive medicine to help you relax (sedative).  As soon as you are asleep, a breathing tube may be used to help you breathe.  An anesthesia specialist will stay with you throughout the procedure. He or she will help keep you comfortable and safe by continuing to give you medicines and adjusting the amount of medicine that you get. He or she will also watch your blood pressure, pulse, and oxygen levels to make sure that the anesthetics do not cause any problems.  If a breathing tube was used to help you breathe, it will be removed before you wake up. The procedure may vary among health care providers and hospitals. What happens after the procedure?  You will wake up, often slowly, after the procedure is complete, usually in a recovery area.  Your blood pressure, heart rate, breathing rate, and blood oxygen level will be monitored until the medicines you were given have worn off.  You may be given medicine to help you calm down if you feel anxious or agitated.  If you will be going home the same day, your health care provider may check to make sure you can stand, drink, and urinate.  Your health care providers will treat your pain and side effects before you go home.  Do not drive for 24 hours if you received a sedative.  You may: ? Feel  nauseous and vomit. ? Have a sore throat. ? Have mental slowness. ? Feel cold or shivery. ? Feel sleepy. ? Feel tired. ? Feel sore or achy, even in parts of your body where you did not have surgery. This information is not intended to replace advice given to you by your health care provider. Make sure you discuss any questions you have with your health care provider. Document Released: 11/17/2007 Document Revised: 01/21/2016 Document Reviewed: 07/25/2015 Elsevier Interactive Patient Education  2018 [employer]. General Anesthesia, Adult, Care After These instructions provide you with information about caring for yourself after your procedure. Your health care provider may also give you more specific instructions. Your treatment has been planned according to current medical practices, but problems sometimes occur. Call your health care provider if you have any problems or questions after your procedure. What can I expect after the procedure? After the procedure, it is common to have:  Vomiting.  A sore throat.  Mental slowness.  It is common to feel:  Nauseous.  Cold or shivery.  Sleepy.  Tired.  Sore or achy, even in parts of your body where you did not have surgery.  Follow these instructions at home: For at least 24 hours after the procedure:  Do not: ? Participate in activities where you could fall or become injured. ? Drive. ? Use heavy machinery. ? Drink alcohol. ? Take sleeping pills or medicines that cause drowsiness. ? Make important decisions or sign legal documents. ? Take care of children on your own.  Rest. Eating and drinking  If you vomit, drink water, juice, or soup when you can drink without vomiting.  Drink enough fluid to keep your urine clear or pale yellow.  Make sure you have little or no nausea before eating solid foods.  Follow the diet recommended by your health care provider. General instructions  Have a responsible adult stay with  you until you are awake and alert.  Return to your normal activities as told by your health care provider. Ask your health care provider what activities are safe for you.  Take over-the-counter and prescription medicines only as told by your health care provider.  If you smoke, do not smoke without supervision.  Keep all follow-up visits as told by your health care provider. This is important. Contact a health care provider if:  You continue to have nausea or vomiting at home, and medicines are not helpful.  You cannot drink fluids or start eating again.  You cannot urinate after 8-12 hours.  You develop a skin rash.  You have fever.  You have increasing redness at the site of your procedure. Get help right away if:  You have difficulty breathing.  You have chest pain.  You have unexpected bleeding.  You feel that you are having a life-threatening or urgent problem. This information is not intended to replace advice given to you by your health care provider. Make sure you discuss any questions you have with your health care provider. Document Released: 11/16/2000 Document Revised: 01/13/2016 Document Reviewed: 07/25/2015 Elsevier Interactive Patient Education  [employer]2018 Elsevier Inc.

## 2017-08-23 ENCOUNTER — Encounter (HOSPITAL_COMMUNITY): Payer: Self-pay

## 2017-08-23 ENCOUNTER — Other Ambulatory Visit: Payer: Self-pay

## 2017-08-23 ENCOUNTER — Encounter (HOSPITAL_COMMUNITY)
Admission: RE | Admit: 2017-08-23 | Discharge: 2017-08-23 | Disposition: A | Discharge status: Home / Self Care | Payer: BC Managed Care – PPO | Source: Ambulatory Visit | Attending: General Surgery | Admitting: General Surgery

## 2017-08-23 DIAGNOSIS — Z01812 Encounter for preprocedural laboratory examination: Secondary | ICD-10-CM | POA: Diagnosis present

## 2017-08-23 DIAGNOSIS — Z8249 Family history of ischemic heart disease and other diseases of the circulatory system: Secondary | ICD-10-CM | POA: Insufficient documentation

## 2017-08-23 DIAGNOSIS — Z87442 Personal history of urinary calculi: Secondary | ICD-10-CM | POA: Diagnosis not present

## 2017-08-23 HISTORY — DX: Family history of other specified conditions: Z84.89

## 2017-08-23 HISTORY — DX: Personal history of urinary calculi: Z87.442

## 2017-08-23 LAB — CBC WITH DIFFERENTIAL/PLATELET
Basophils Absolute: 0 10*3/uL (ref 0.0–0.1)
Basophils Relative: 0 %
EOS ABS: 0.1 10*3/uL (ref 0.0–0.7)
Eosinophils Relative: 1 %
HCT: 38.7 % (ref 36.0–46.0)
HEMOGLOBIN: 12.5 g/dL (ref 12.0–15.0)
LYMPHS ABS: 2.1 10*3/uL (ref 0.7–4.0)
LYMPHS PCT: 21 %
MCH: 29.1 pg (ref 26.0–34.0)
MCHC: 32.3 g/dL (ref 30.0–36.0)
MCV: 90.2 fL (ref 78.0–100.0)
Monocytes Absolute: 0.6 10*3/uL (ref 0.1–1.0)
Monocytes Relative: 6 %
NEUTROS ABS: 7 10*3/uL (ref 1.7–7.7)
NEUTROS PCT: 72 %
Platelets: 336 10*3/uL (ref 150–400)
RBC: 4.29 MIL/uL (ref 3.87–5.11)
RDW: 14 % (ref 11.5–15.5)
WBC: 9.8 10*3/uL (ref 4.0–10.5)

## 2017-08-23 LAB — BASIC METABOLIC PANEL
Anion gap: 13 (ref 5–15)
BUN: 15 mg/dL (ref 6–20)
CHLORIDE: 104 mmol/L (ref 101–111)
CO2: 20 mmol/L — ABNORMAL LOW (ref 22–32)
Calcium: 9.7 mg/dL (ref 8.9–10.3)
Creatinine, Ser: 0.78 mg/dL (ref 0.44–1.00)
GFR calc non Af Amer: >60 mL/min (ref >60)
Glucose, Bld: 70 mg/dL (ref 65–99)
POTASSIUM: 3.9 mmol/L (ref 3.5–5.1)
SODIUM: 137 mmol/L (ref 135–145)

## 2017-08-23 LAB — HCG, SERUM, QUALITATIVE: PREG SERUM: NEGATIVE

## 2017-08-27 ENCOUNTER — Ambulatory Visit (HOSPITAL_COMMUNITY): Payer: BC Managed Care – PPO | Admitting: Anesthesiology

## 2017-08-27 ENCOUNTER — Encounter (HOSPITAL_COMMUNITY): Payer: Self-pay | Admitting: *Deleted

## 2017-08-27 ENCOUNTER — Encounter (HOSPITAL_COMMUNITY)
Admission: RE | Disposition: A | Discharge status: Home / Self Care | Payer: Self-pay | Source: Ambulatory Visit | Attending: General Surgery

## 2017-08-27 ENCOUNTER — Ambulatory Visit (HOSPITAL_COMMUNITY)
Admission: RE | Admit: 2017-08-27 | Discharge: 2017-08-27 | Disposition: A | Discharge status: Home / Self Care | Payer: BC Managed Care – PPO | Source: Ambulatory Visit | Attending: General Surgery | Admitting: General Surgery

## 2017-08-27 DIAGNOSIS — K8020 Calculus of gallbladder without cholecystitis without obstruction: Secondary | ICD-10-CM | POA: Diagnosis not present

## 2017-08-27 DIAGNOSIS — I700 Atherosclerosis of aorta: Secondary | ICD-10-CM | POA: Diagnosis not present

## 2017-08-27 DIAGNOSIS — K8010 Calculus of gallbladder with chronic cholecystitis without obstruction: Secondary | ICD-10-CM | POA: Insufficient documentation

## 2017-08-27 DIAGNOSIS — Z8249 Family history of ischemic heart disease and other diseases of the circulatory system: Secondary | ICD-10-CM | POA: Diagnosis not present

## 2017-08-27 HISTORY — PX: CHOLECYSTECTOMY: SHX55

## 2017-08-27 SURGERY — LAPAROSCOPIC CHOLECYSTECTOMY
Anesthesia: General | Site: Abdomen

## 2017-08-27 MED ORDER — MIDAZOLAM HCL 2 MG/2ML IJ SOLN
INTRAMUSCULAR | Status: AC
  Filled 2017-08-27: qty 2

## 2017-08-27 MED ORDER — FENTANYL CITRATE (PF) 100 MCG/2ML IJ SOLN
INTRAMUSCULAR | Status: DC | PRN
  Administered 2017-08-27: 50 ug via INTRAVENOUS
  Administered 2017-08-27: 100 ug via INTRAVENOUS
  Administered 2017-08-27 (×2): 50 ug via INTRAVENOUS

## 2017-08-27 MED ORDER — NEOSTIGMINE METHYLSULFATE 10 MG/10ML IV SOLN
INTRAVENOUS | Status: DC | PRN
  Administered 2017-08-27: 3 mg via INTRAVENOUS

## 2017-08-27 MED ORDER — SCOPOLAMINE 1 MG/3DAYS TD PT72
MEDICATED_PATCH | TRANSDERMAL | Status: AC
  Filled 2017-08-27: qty 1

## 2017-08-27 MED ORDER — ROCURONIUM BROMIDE 100 MG/10ML IV SOLN
INTRAVENOUS | Status: DC | PRN
  Administered 2017-08-27: 35 mg via INTRAVENOUS
  Administered 2017-08-27: 5 mg via INTRAVENOUS

## 2017-08-27 MED ORDER — ONDANSETRON HCL 4 MG/2ML IJ SOLN
INTRAMUSCULAR | Status: AC
  Filled 2017-08-27: qty 2

## 2017-08-27 MED ORDER — PROPOFOL 10 MG/ML IV BOLUS
INTRAVENOUS | Status: DC | PRN
  Administered 2017-08-27: 150 mg via INTRAVENOUS
  Administered 2017-08-27: 50 mg via INTRAVENOUS

## 2017-08-27 MED ORDER — FENTANYL CITRATE (PF) 100 MCG/2ML IJ SOLN
INTRAMUSCULAR | Status: AC
  Filled 2017-08-27: qty 2

## 2017-08-27 MED ORDER — LIDOCAINE HCL (PF) 1 % IJ SOLN
INTRAMUSCULAR | Status: AC
  Filled 2017-08-27: qty 5

## 2017-08-27 MED ORDER — BUPIVACAINE HCL (PF) 0.5 % IJ SOLN
INTRAMUSCULAR | Status: AC
  Filled 2017-08-27: qty 30

## 2017-08-27 MED ORDER — SCOPOLAMINE 1 MG/3DAYS TD PT72
1.0000 | MEDICATED_PATCH | Freq: Once | TRANSDERMAL | Status: DC
  Administered 2017-08-27: 1.5 mg via TRANSDERMAL

## 2017-08-27 MED ORDER — ONDANSETRON HCL 4 MG/2ML IJ SOLN
4.0000 mg | Freq: Once | INTRAMUSCULAR | Status: AC
  Administered 2017-08-27: 4 mg via INTRAVENOUS

## 2017-08-27 MED ORDER — HEMOSTATIC AGENTS (NO CHARGE) OPTIME
TOPICAL | Status: DC | PRN
  Administered 2017-08-27: 1 via TOPICAL

## 2017-08-27 MED ORDER — PROPOFOL 10 MG/ML IV BOLUS
INTRAVENOUS | Status: AC
  Filled 2017-08-27: qty 40

## 2017-08-27 MED ORDER — MIDAZOLAM HCL 2 MG/2ML IJ SOLN
1.0000 mg | INTRAMUSCULAR | Status: AC
  Administered 2017-08-27: 2 mg via INTRAVENOUS

## 2017-08-27 MED ORDER — SODIUM CHLORIDE 0.9% FLUSH
INTRAVENOUS | Status: AC
  Filled 2017-08-27: qty 10

## 2017-08-27 MED ORDER — CEFAZOLIN SODIUM-DEXTROSE 2-4 GM/100ML-% IV SOLN
2.0000 g | INTRAVENOUS | Status: AC
  Administered 2017-08-27: 2 g via INTRAVENOUS
  Filled 2017-08-27: qty 100

## 2017-08-27 MED ORDER — DEXAMETHASONE SODIUM PHOSPHATE 4 MG/ML IJ SOLN
INTRAMUSCULAR | Status: AC
  Filled 2017-08-27: qty 1

## 2017-08-27 MED ORDER — SODIUM CHLORIDE 0.9 % IR SOLN
Status: DC | PRN
  Administered 2017-08-27: 1000 mL

## 2017-08-27 MED ORDER — ROCURONIUM BROMIDE 50 MG/5ML IV SOLN
INTRAVENOUS | Status: AC
  Filled 2017-08-27: qty 1

## 2017-08-27 MED ORDER — FENTANYL CITRATE (PF) 100 MCG/2ML IJ SOLN
25.0000 ug | INTRAMUSCULAR | Status: DC | PRN
  Administered 2017-08-27: 50 ug via INTRAVENOUS

## 2017-08-27 MED ORDER — CHLORHEXIDINE GLUCONATE CLOTH 2 % EX PADS: 6.0000 | MEDICATED_PAD | Freq: Once | CUTANEOUS | Status: DC

## 2017-08-27 MED ORDER — LIDOCAINE HCL (CARDIAC) 10 MG/ML IV SOLN
INTRAVENOUS | Status: DC | PRN
  Administered 2017-08-27: 50 mg via INTRAVENOUS

## 2017-08-27 MED ORDER — FENTANYL CITRATE (PF) 250 MCG/5ML IJ SOLN
INTRAMUSCULAR | Status: AC
  Filled 2017-08-27: qty 5

## 2017-08-27 MED ORDER — DEXAMETHASONE SODIUM PHOSPHATE 4 MG/ML IJ SOLN
4.0000 mg | Freq: Once | INTRAMUSCULAR | Status: AC
  Administered 2017-08-27: 4 mg via INTRAVENOUS

## 2017-08-27 MED ORDER — OXYCODONE HCL 5 MG PO TABS: 5.0000 mg | ORAL_TABLET | ORAL | 0 refills | Status: AC | PRN

## 2017-08-27 MED ORDER — LACTATED RINGERS IV SOLN
INTRAVENOUS | Status: DC
  Administered 2017-08-27: 07:00:00 via INTRAVENOUS

## 2017-08-27 MED ORDER — GLYCOPYRROLATE 0.2 MG/ML IJ SOLN
INTRAMUSCULAR | Status: DC | PRN
  Administered 2017-08-27: 0.6 mg via INTRAVENOUS

## 2017-08-27 MED ORDER — GLYCOPYRROLATE 0.2 MG/ML IJ SOLN
INTRAMUSCULAR | Status: AC
  Filled 2017-08-27: qty 3

## 2017-08-27 MED ORDER — DOCUSATE SODIUM 100 MG PO CAPS: 100.0000 mg | ORAL_CAPSULE | Freq: Two times a day (BID) | ORAL | 2 refills | Status: AC | PRN

## 2017-08-27 MED ORDER — BUPIVACAINE HCL (PF) 0.5 % IJ SOLN
INTRAMUSCULAR | Status: DC | PRN
  Administered 2017-08-27: 10 mL

## 2017-08-27 MED ORDER — NEOSTIGMINE METHYLSULFATE 10 MG/10ML IV SOLN
INTRAVENOUS | Status: AC
  Filled 2017-08-27: qty 1

## 2017-08-27 SURGICAL SUPPLY — 47 items
ADH SKN CLS APL DERMABOND .7 (GAUZE/BANDAGES/DRESSINGS) ×1
APPLIER CLIP ROT 10 11.4 M/L (STAPLE) ×3
APR CLP MED LRG 11.4X10 (STAPLE) ×1
BAG HAMPER (MISCELLANEOUS) ×3 IMPLANT
BAG RETRIEVAL 10 (BASKET) ×1
BAG RETRIEVAL 10MM (BASKET) ×1
BLADE SURG 15 STRL LF DISP TIS (BLADE) ×1 IMPLANT
BLADE SURG 15 STRL SS (BLADE) ×3
CHLORAPREP W/TINT 26ML (MISCELLANEOUS) ×3 IMPLANT
CLIP APPLIE ROT 10 11.4 M/L (STAPLE) ×1 IMPLANT
CLOTH BEACON ORANGE TIMEOUT ST (SAFETY) ×3 IMPLANT
COVER LIGHT HANDLE STERIS (MISCELLANEOUS) ×6 IMPLANT
DECANTER SPIKE VIAL GLASS SM (MISCELLANEOUS) ×3 IMPLANT
DERMABOND ADVANCED (GAUZE/BANDAGES/DRESSINGS) ×2
DERMABOND ADVANCED .7 DNX12 (GAUZE/BANDAGES/DRESSINGS) ×1 IMPLANT
ELECT REM PT RETURN 9FT ADLT (ELECTROSURGICAL) ×3
ELECTRODE REM PT RTRN 9FT ADLT (ELECTROSURGICAL) ×1 IMPLANT
FILTER SMOKE EVAC LAPAROSHD (FILTER) ×3 IMPLANT
GLOVE BIO SURGEON STRL SZ 6.5 (GLOVE) ×2 IMPLANT
GLOVE BIO SURGEONS STRL SZ 6.5 (GLOVE) ×1
GLOVE BIOGEL PI IND STRL 6.5 (GLOVE) ×1 IMPLANT
GLOVE BIOGEL PI IND STRL 7.0 (GLOVE) ×2 IMPLANT
GLOVE BIOGEL PI INDICATOR 6.5 (GLOVE) ×2
GLOVE BIOGEL PI INDICATOR 7.0 (GLOVE) ×4
GOWN STRL REUS W/TWL LRG LVL3 (GOWN DISPOSABLE) ×9 IMPLANT
HEMOSTAT SNOW SURGICEL 2X4 (HEMOSTASIS) ×3 IMPLANT
INST SET LAPROSCOPIC AP (KITS) ×3 IMPLANT
KIT ROOM TURNOVER APOR (KITS) ×3 IMPLANT
MANIFOLD NEPTUNE II (INSTRUMENTS) ×3 IMPLANT
NDL INSUFFLATION 14GA 120MM (NEEDLE) ×1 IMPLANT
NEEDLE INSUFFLATION 14GA 120MM (NEEDLE) ×3 IMPLANT
NS IRRIG 1000ML POUR BTL (IV SOLUTION) ×3 IMPLANT
PACK LAP CHOLE LZT030E (CUSTOM PROCEDURE TRAY) ×3 IMPLANT
PAD ARMBOARD 7.5X6 YLW CONV (MISCELLANEOUS) ×3 IMPLANT
SET BASIN LINEN APH (SET/KITS/TRAYS/PACK) ×3 IMPLANT
SLEEVE ENDOPATH XCEL 5M (ENDOMECHANICALS) ×3 IMPLANT
SUT MNCRL AB 4-0 PS2 18 (SUTURE) ×5 IMPLANT
SUT VICRYL 0 UR6 27IN ABS (SUTURE) ×3 IMPLANT
SYS BAG RETRIEVAL 10MM (BASKET) ×1
SYSTEM BAG RETRIEVAL 10MM (BASKET) ×1 IMPLANT
TROCAR ENDO BLADELESS 11MM (ENDOMECHANICALS) ×3 IMPLANT
TROCAR XCEL NON-BLD 5MMX100MML (ENDOMECHANICALS) ×3 IMPLANT
TROCAR XCEL UNIV SLVE 11M 100M (ENDOMECHANICALS) ×3 IMPLANT
TUBE CONNECTING 12'X1/4 (SUCTIONS) ×1
TUBE CONNECTING 12X1/4 (SUCTIONS) ×2 IMPLANT
TUBING INSUFFLATION (TUBING) ×3 IMPLANT
WARMER LAPAROSCOPE (MISCELLANEOUS) ×3 IMPLANT

## 2017-08-27 NOTE — Transfer of Care (Signed)
Immediate Anesthesia Transfer of Care Note  Patient: Amy DonningRebecca M Schwalm  Procedure(s) Performed: LAPAROSCOPIC CHOLECYSTECTOMY (N/A Abdomen)  Patient Location: PACU  Anesthesia Type:General  Level of Consciousness: sedated and patient cooperative  Airway & Oxygen Therapy: Patient Spontanous Breathing and Patient connected to nasal cannula oxygen  Post-op Assessment: Report given to RN and Post -op Vital signs reviewed and stable  Post vital signs: Reviewed and stable  Last Vitals:  Vitals:   08/27/17 0645 08/27/17 0700  BP: 133/88 129/89  Resp: 14 15  Temp:    SpO2:  92%    Last Pain:  Vitals:   08/27/17 0624  TempSrc: Oral         Complications: No apparent anesthesia complications

## 2017-08-27 NOTE — Anesthesia Postprocedure Evaluation (Signed)
Anesthesia Post Note  Patient: Amy Dickerson  Procedure(s) Performed: LAPAROSCOPIC CHOLECYSTECTOMY (N/A Abdomen)  Patient location during evaluation: PACU Anesthesia Type: General Level of consciousness: awake and patient cooperative Pain management: satisfactory to patient Vital Signs Assessment: post-procedure vital signs reviewed and stable Respiratory status: spontaneous breathing and patient connected to nasal cannula oxygen Cardiovascular status: stable Postop Assessment: no apparent nausea or vomiting Anesthetic complications: no     Last Vitals:  Vitals:   08/27/17 0915 08/27/17 0930  BP: (!) 146/82 134/81  Pulse: 87 81  Resp: 11 17  Temp:    SpO2: 99% 95%    Last Pain:  Vitals:   08/27/17 0624  TempSrc: Oral                 Kosta Schnitzler

## 2017-08-27 NOTE — Discharge Instructions (Signed)
Discharge Instructions: Shower per your regular routine. Remove bandaid/ gauze from the upper wound in 24 hrs.  Your skin was a little red from the glue following the procedure and you had some minor drainage from the upper wound edge, so the bandage was placed. If the redness gets worse or the drainage increases or becomes white / purulent, please call the office. A small amount of pink drainage is ok.  Keep a bandaid or gauze over the area if draining.  Please call with any concerns.  Take tylenol and ibuprofen as needed for pain control, alternating every 4-6 hours.  Take Roxicodone for breakthrough pain. Take colace for constipation related to narcotic pain medication. Do not pick at the dermabond glue on your incision sites.   Laparoscopic Cholecystectomy, Care After This sheet gives you information about how to care for yourself after your procedure. Your doctor may also give you more specific instructions. If you have problems or questions, contact your doctor. Follow these instructions at home: Care for cuts from surgery (incisions)   Follow instructions from your doctor about how to take care of your cuts from surgery. Make sure you: ? Wash your hands with soap and water before you change your bandage (dressing). If you cannot use soap and water, use hand sanitizer. ? Change your bandage as told by your doctor. ? Leave stitches (sutures), skin glue, or skin tape (adhesive) strips in place. They may need to stay in place for 2 weeks or longer. If tape strips get loose and curl up, you may trim the loose edges. Do not remove tape strips completely unless your doctor says it is okay.  Do not take baths, swim, or use a hot tub until your doctor says it is okay. Ask your doctor if you can take showers. You may only be allowed to take sponge baths for bathing.  Check your surgical cut area every day for signs of infection. Check for: ? More redness, swelling, or pain. ? More fluid or  blood. ? Warmth. ? Pus or a bad smell. Activity  Do not drive or use heavy machinery while taking prescription pain medicine.  Do not lift anything that is heavier than 10 lb (4.5 kg) until your doctor says it is okay.  Do not play contact sports until your doctor says it is okay.  Do not drive for 24 hours if you were given a medicine to help you relax (sedative).  Rest as needed. Do not return to work or school until your doctor says it is okay. General instructions  Take over-the-counter and prescription medicines only as told by your doctor.  To prevent or treat constipation while you are taking prescription pain medicine, your doctor may recommend that you: ? Drink enough fluid to keep your pee (urine) clear or pale yellow. ? Take over-the-counter or prescription medicines. ? Eat foods that are high in fiber, such as fresh fruits and vegetables, whole grains, and beans. ? Limit foods that are high in fat and processed sugars, such as fried and sweet foods. Contact a doctor if:  You develop a rash.  You have more redness, swelling, or pain around your surgical cuts.  You have more fluid or blood coming from your surgical cuts.  Your surgical cuts feel warm to the touch.  You have pus or a bad smell coming from your surgical cuts.  You have a fever.  One or more of your surgical cuts breaks open. Get help right away if:  You have trouble breathing.  You have chest pain.  You have pain that is getting worse in your shoulders.  You faint or feel dizzy when you stand.  You have very bad pain in your belly (abdomen).  You are sick to your stomach (nauseous) for more than one day.  You have throwing up (vomiting) that lasts for more than one day.  You have leg pain. This information is not intended to replace advice given to you by your health care provider. Make sure you discuss any questions you have with your health care provider. Document Released: 05/19/2008  Document Revised: 02/29/2016 Document Reviewed: 01/27/2016 Elsevier Interactive Patient Education  2018 [employer]

## 2017-08-27 NOTE — Anesthesia Preprocedure Evaluation (Signed)
Anesthesia Evaluation  Patient identified by MRN, date of birth, ID band Patient awake    Reviewed: Allergy & Precautions, NPO status , Patient's Chart, lab work & pertinent test results  History of Anesthesia Complications (+) Family history of anesthesia reaction and history of anesthetic complications ( PONV)  Airway Mallampati: II  TM Distance: >3 FB Neck ROM: Full    Dental  (+) Teeth Intact   Pulmonary neg pulmonary ROS,    breath sounds clear to auscultation       Cardiovascular negative cardio ROS   Rhythm:Regular Rate:Normal     Neuro/Psych negative neurological ROS  negative psych ROS   GI/Hepatic negative GI ROS, Neg liver ROS, neg GERD  ,  Endo/Other  negative endocrine ROS  Renal/GU negative Renal ROS     Musculoskeletal   Abdominal   Peds  Hematology negative hematology ROS (+)   Anesthesia Other Findings   Reproductive/Obstetrics                             Anesthesia Physical Anesthesia Plan  ASA: I  Anesthesia Plan: General   Post-op Pain Management:    Induction: Intravenous  PONV Risk Score and Plan:   Airway Management Planned: Oral ETT  Additional Equipment:   Intra-op Plan:   Post-operative Plan: Extubation in OR  Informed Consent: I have reviewed the patients History and Physical, chart, labs and discussed the procedure including the risks, benefits and alternatives for the proposed anesthesia with the patient or authorized representative who has indicated his/her understanding and acceptance.     Plan Discussed with:   Anesthesia Plan Comments:         Anesthesia Quick Evaluation

## 2017-08-27 NOTE — Anesthesia Procedure Notes (Signed)
Procedure Name: Intubation Date/Time: 08/27/2017 7:34 AM Performed by: Vista Deck, CRNA Pre-anesthesia Checklist: Patient identified, Patient being monitored, Timeout performed, Emergency Drugs available and Suction available Patient Re-evaluated:Patient Re-evaluated prior to induction Oxygen Delivery Method: Circle System Utilized Preoxygenation: Pre-oxygenation with 100% oxygen Induction Type: IV induction Ventilation: Mask ventilation without difficulty Laryngoscope Size: Mac and 3 Grade View: Grade I Tube type: Oral Tube size: 7.0 mm Number of attempts: 1 Airway Equipment and Method: stylet and Oral airway Placement Confirmation: ETT inserted through vocal cords under direct vision,  positive ETCO2 and breath sounds checked- equal and bilateral Secured at: 21 cm Tube secured with: Tape Dental Injury: Teeth and Oropharynx as per pre-operative assessment

## 2017-08-27 NOTE — Interval H&P Note (Signed)
History and Physical Interval Note:  08/27/2017 7:17 AM  Amy Dickerson  has presented today for surgery, with the diagnosis of cholelithiasis  The various methods of treatment have been discussed with the patient and family. After consideration of risks, benefits and other options for treatment, the patient has consented to  Procedure(s): LAPAROSCOPIC CHOLECYSTECTOMY (N/A) as a surgical intervention .  The patient's history has been reviewed, patient examined, no change in status, stable for surgery.  I have reviewed the patient's chart and labs.  Questions were answered to the patient's satisfaction.    No additional questions regarding lap chole. No additional episodes of pain since visit to ED.  Lucretia RoersLindsay C Bridges

## 2017-08-27 NOTE — Op Note (Signed)
Operative Note   Preoperative Diagnosis: Symptomatic cholelithiasis   Postoperative Diagnosis: Same   Procedure(s) Performed: Laparoscopic cholecystectomy   Surgeon: Lillia AbedLindsay C. Henreitta LeberBridges, MD   Assistants: Franky MachoMark Jenkins, MD    Anesthesia: General endotracheal   Anesthesiologist: Dr. Jayme CloudGonzalez    Specimens: Gallbladder    Estimated Blood Loss: Minimal    Blood Replacement: None    Complications: Bag ripped   Wound Class: Contaminated    Operative Findings: Distended gallbladder with large stone at infundibulum    Procedure: The patient was taken to the operating room and placed supine. General endotracheal anesthesia was induced. Intravenous antibiotics were administered per protocol. An orogastric tube positioned to decompress the stomach. The abdomen was prepared and draped in the usual sterile fashion.    A supraumbilical incision was made and a Veress technique was utilized to achieve pneumoperitoneum to 15 mmHg with carbon dioxide. A 11 mm optiview port was placed through the supraumbilical region, and a 10 mm 0-degree operative laparoscope was introduced. The area underlying the trocar and Veress needle were inspected and without evidence of injury.  Remaining trocars were placed under direct vision. Two 5 mm ports were placed in the right abdomen, between the anterior axillary and midclavicular line.  A final 11 mm port was placed through the mid-epigastrium, near the falciform ligament.    The gallbladder fundus was elevated cephalad and the infundibulum was retracted to the patient's right. The gallbladder/cystic duct junction was skeletonized. The cystic artery noted in the triangle of Calot and was also skeletonized.  We then continued liberal medial and lateral dissection until the critical view of safety was achieved.    The cystic duct and cystic artery were doubly clipped and divided using locking clips. The gallbladder was then dissected from the liver bed with  electrocautery.  There was some spillage of bile that was suctioned from the fossa.  Final inspection revealed acceptable hemostasis. Surgical Jamelle HaringSnow was placed in the gallbladder fossa. The specimen was placed in an Endopouch and was retrieved through the epigastric site. The bag did rip during removal.  The gallbladder was intact and all stones remained in the gallbladder. The wound was irrigated copiously.     A 0 Vicryl fascial sutures at the epigastric port site. Trocars were removed and pneumoperitoneum was released. Skin incisions were closed with 4-0 Monocryl subcuticular sutures and Dermabond. The patient was awakened from anesthesia and extubated without complication.    Algis GreenhouseLindsay Janiel Crisostomo, MD [facility] [address] [place], [place] [phone] (office)

## 2017-08-30 ENCOUNTER — Encounter (HOSPITAL_COMMUNITY): Payer: Self-pay | Admitting: General Surgery

## 2017-09-09 ENCOUNTER — Ambulatory Visit (INDEPENDENT_AMBULATORY_CARE_PROVIDER_SITE_OTHER): Payer: Self-pay | Admitting: General Surgery

## 2017-09-09 ENCOUNTER — Encounter: Payer: Self-pay | Admitting: General Surgery

## 2017-09-09 VITALS — BP 144/91 | HR 87 | Temp 98.4°F | Resp 18 | Ht 65.0 in | Wt 207.0 lb

## 2017-09-09 DIAGNOSIS — K8020 Calculus of gallbladder without cholecystitis without obstruction: Secondary | ICD-10-CM

## 2017-09-09 NOTE — Progress Notes (Signed)
[facility] Note   HPI:  47 y.o. Female presents to clinic for post-op follow-up evaluation after a laparoscopic cholecystectomy. She is doing well but did have some RUQ pain that felt like a pulled muscle this week.   Review of Systems:  No nausea/vomiting Eating and having BMs All other review of systems: otherwise negative   Pathology: Gallbladder - CHRONIC CHOLECYSTITIS AND CHOLELITHIASIS. - THERE IS NO EVIDENCE OF MALIGNANCY.   Vital Signs:  BP (!) 144/91   Pulse 87   Temp 98.4 F (36.9 C)   Resp 18   Ht 5\' 5"  (1.651 m)   Wt 207 lb (93.9 kg)   BMI 34.45 kg/m    Physical Exam:  Physical Exam  Constitutional: She is well-developed, well-nourished, and in no distress.  Cardiovascular: Normal rate.  Pulmonary/Chest: Effort normal.  Abdominal: Soft. She exhibits no distension. There is no tenderness.  Port sites healed, no erythema or drainage  Vitals reviewed.   Laboratory studies: None   Imaging:  None   Assessment:  47 y.o. yo Female s/p laparoscopic cholecystectomy for chronic cholecystitis, cholelithiasis. She is doing well.  Plan:  - Take ibuprofen for muscle pain, should improve over time   - Follow up PRN    Algis GreenhouseLindsay Bridges, MD [facility] [address] [place], [place] [phone] (office)

## 2018-07-16 IMAGING — CT CT ANGIO CHEST-ABD-PELV FOR DISSECTION W/ AND WO/W CM
2 of 7 series · 13 of 46 positions shown, 15 images · IV contrast (iopamidol)
Comparison: Chest radiograph 07/16/2017

ADDENDUM:
The timing of image acquisition was optimized for assessment of the
aorta rather than pulmonary arteries. There is no central pulmonary
embolus or embolus within the lobar arteries. The appearance of the
proximal segmental arteries is normal, but there is limited
visualization of the distal segmental branches. There are no
secondary findings that would suggest pulmonary embolus.
CLINICAL DATA: Upper back pain radiating to the chest

EXAM:
CT ANGIOGRAPHY CHEST, ABDOMEN AND PELVIS
TECHNIQUE: Multidetector CT imaging through the chest, abdomen and pelvis was
performed using the standard protocol during bolus administration of
intravenous contrast. Multiplanar reconstructed images and MIPs were
obtained and reviewed to evaluate the vascular anatomy.
CONTRAST:  100mL D0B1AM-8EM IOPAMIDOL (D0B1AM-8EM) INJECTION 76%

[Series 5: dissection axial arterial · axial · arterial · 0.78mm/px · z∈[-678,-120]mm · 10 of 218 slices shown, 12 images]
[im 16/218  soft-tissue]
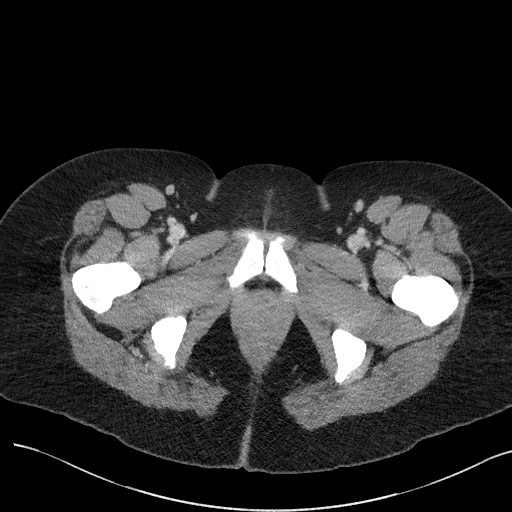
[im 16/218  bone]
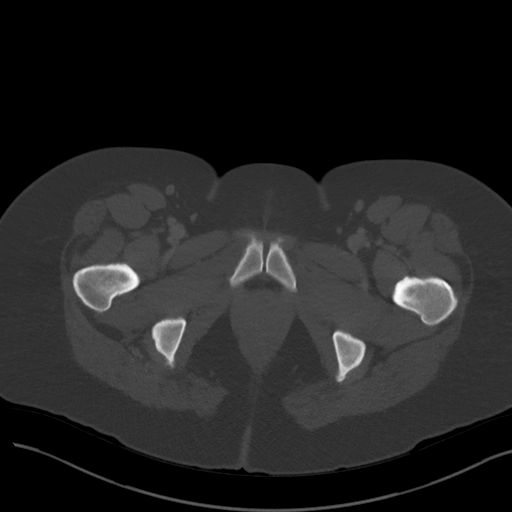
[im 32/218  soft-tissue]
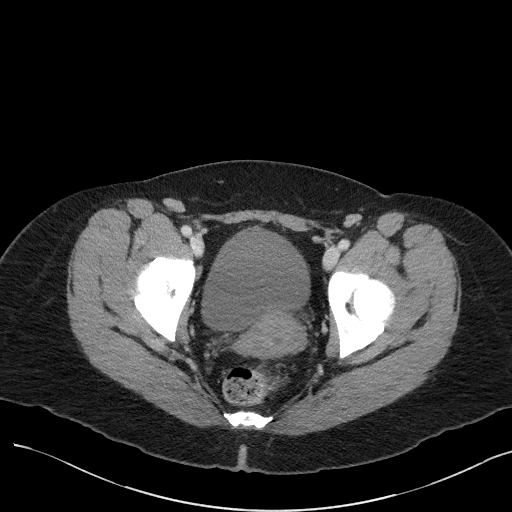
[im 63/218  soft-tissue]
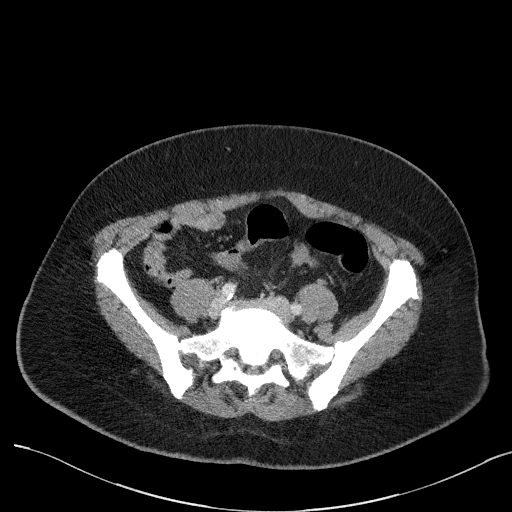
[im 78/218  soft-tissue]
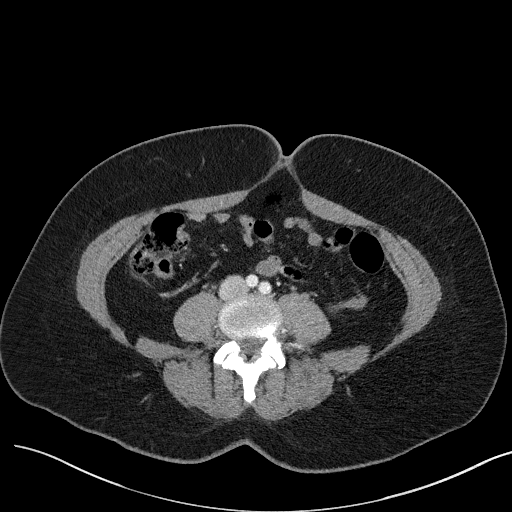
[im 94/218  soft-tissue]
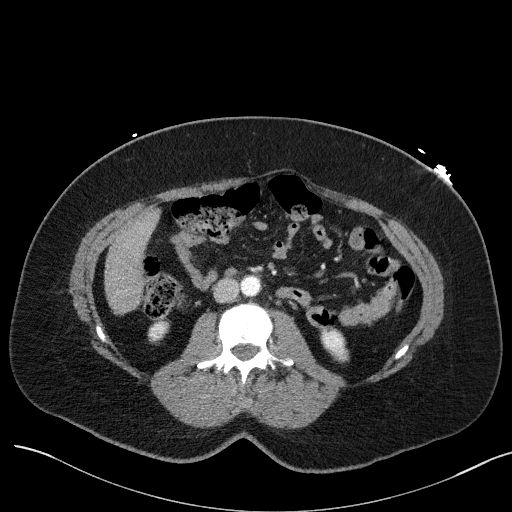
[im 125/218  soft-tissue]
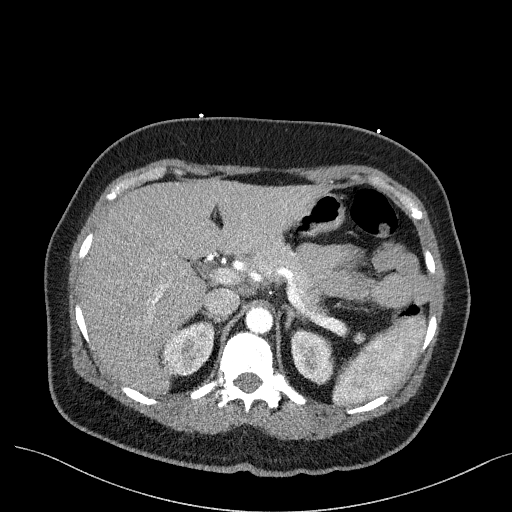
[im 140/218  soft-tissue]
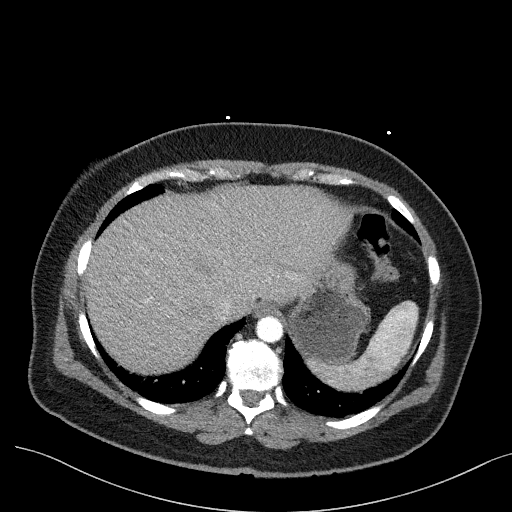
[im 156/218  soft-tissue]
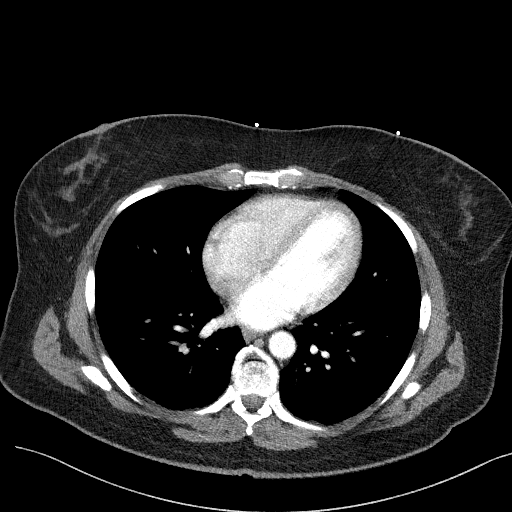
[im 187/218  soft-tissue]
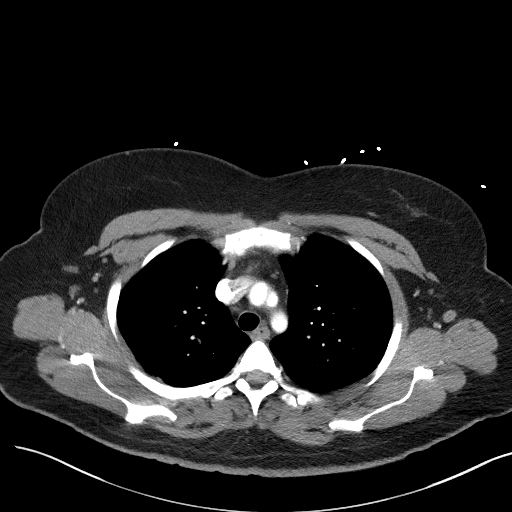
[im 187/218  bone]
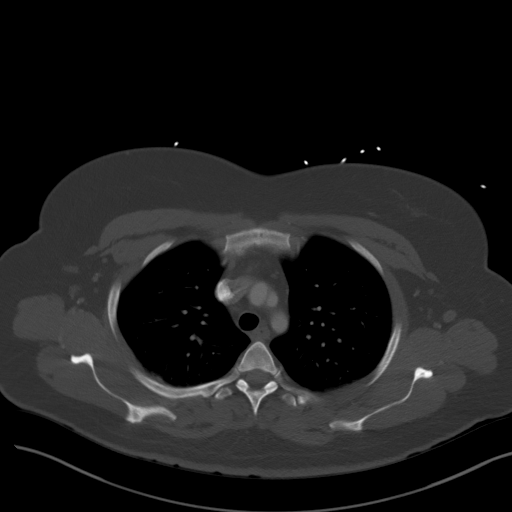
[im 202/218  soft-tissue]
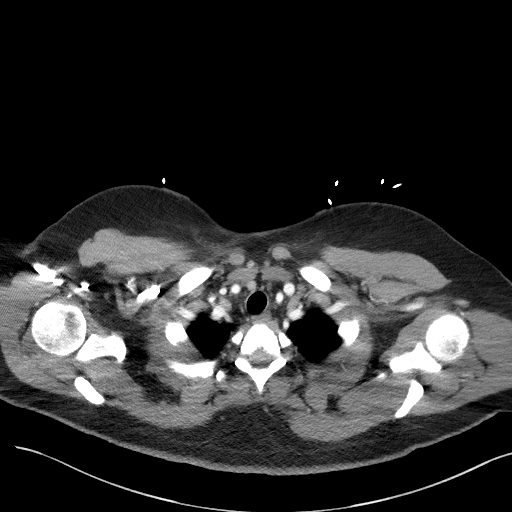

[Series 8: coronals · coronal · 0.73mm/px · 3 of 144 slices shown]
[im 36/144  soft-tissue]
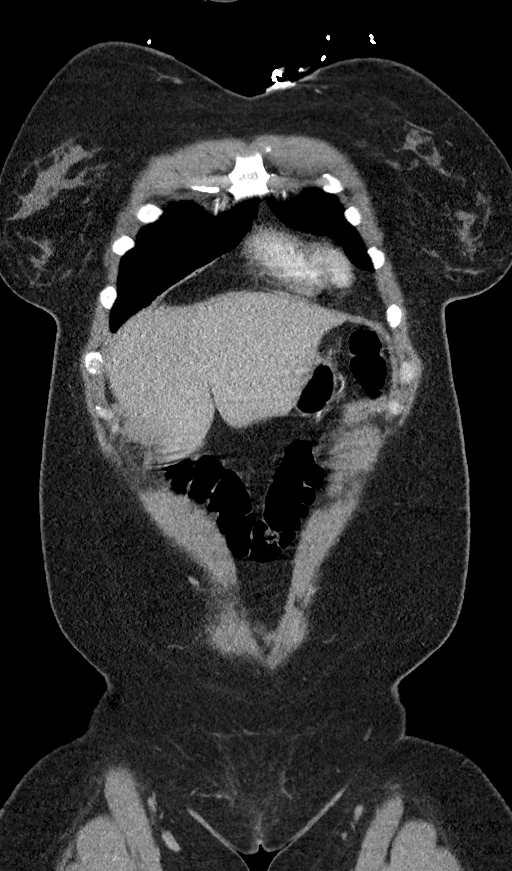
[im 72/144  soft-tissue]
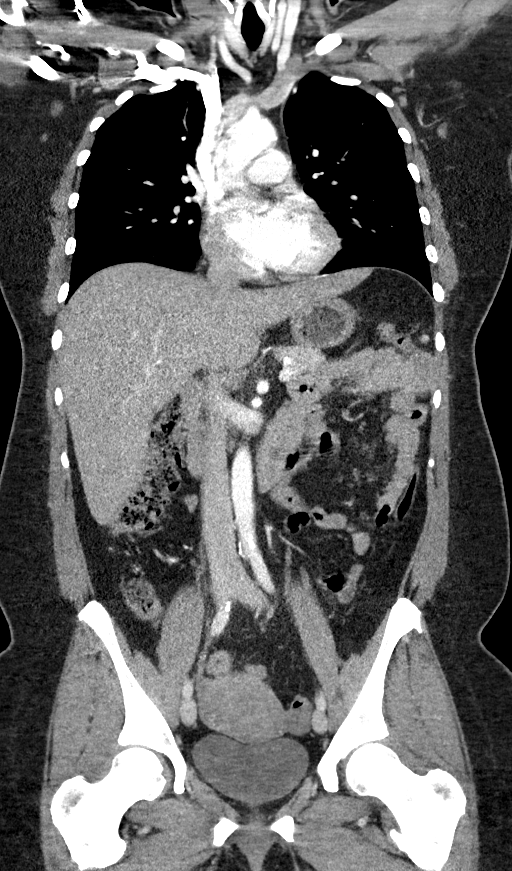
[im 108/144  soft-tissue]
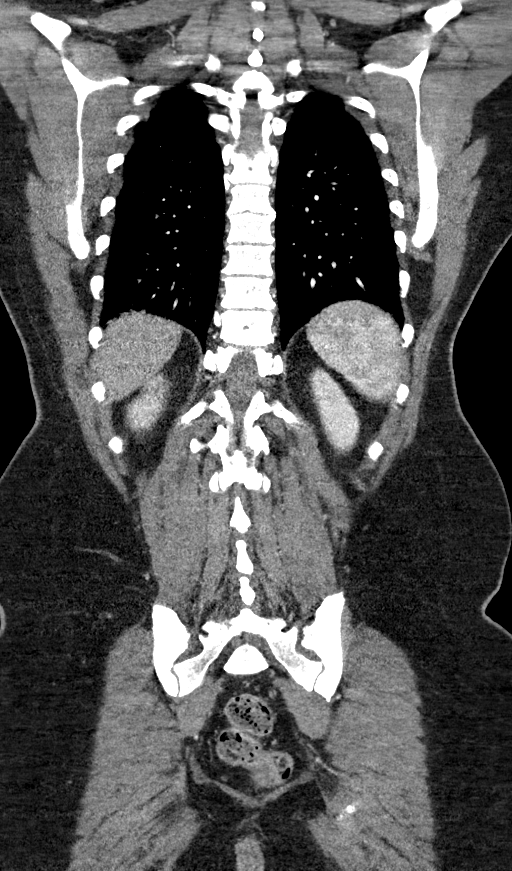

[13 of 46 positions shown; findings below may reference images not displayed]

FINDINGS: CTA CHEST FINDINGS

Cardiovascular: Heart size is normal. There is no pericardial
effusion. The course and caliber of the thoracic aorta are normal.
There is no atherosclerotic calcification. Precontrast imaging shows
no intramural hematoma. There is no dissection or focal ulceration.
There is a normal 3 vessel branching pattern of the aortic arch. The
proximal arch vessels are widely patent.

Mediastinum/Nodes: No mediastinal, hilar or axillary
lymphadenopathy. The visualized thyroid and thoracic esophageal
course are unremarkable.

Lungs/Pleura: No pulmonary nodules or masses. No pleural effusion or
pneumothorax. No focal airspace consolidation. No focal pleural
abnormality.

Musculoskeletal: No chest wall abnormality. No acute osseous
abnormality.

Review of the MIP images confirms the above findings.

CTA ABDOMEN AND PELVIS FINDINGS

VASCULAR

Aorta: Normal caliber aorta without aneurysm, dissection, vasculitis
or hemodynamically significant stenosis. There is calcific aortic
atherosclerosis.

Celiac: No aneurysm, dissection or hemodynamically significant
stenosis. Normal branching pattern.

SMA: Widely patent without dissection or stenosis.

Renals: Single renal arteries bilaterally. No aneurysm, dissection,
stenosis or evidence of fibromuscular dysplasia.

IMA: Patent without abnormality.

Inflow: Minimal atherosclerotic calcification without any stenosis
or other abnormality.

Veins: Normal course and caliber of the major veins. Assessment is
otherwise limited by the arterial dominant contrast phase.

Review of the MIP images confirms the above findings.

NON-VASCULAR

Lower chest: No pulmonary nodules or pleural effusion. No visible
pericardial effusion.

Hepatobiliary: Normal hepatic contours and density. No visible
biliary dilatation. Moderate gallbladder wall thickening versus
large noncalcified gallstone.

Pancreas: Normal contours without ductal dilatation. No
peripancreatic fluid collection.

Spleen: Normal arterial phase appearance of the spleen.

Adrenals/Urinary Tract:

--Adrenal glands: Normal.

--Right kidney/ureter: No hydronephrosis or perinephric stranding.
No nephrolithiasis. No obstructing ureteral stones.

--Left kidney/ureter: No hydronephrosis or perinephric stranding. No
nephrolithiasis. No obstructing ureteral stones.

--Urinary bladder: Unremarkable.

Stomach/Bowel:

--Stomach/Duodenum: No hiatal hernia or other gastric abnormality.
Normal duodenal course and caliber.

--Small bowel: No dilatation or inflammation.

--Colon: No focal abnormality.

--Appendix: Normal.

Lymphatic:  No abdominal or pelvic lymphadenopathy.

Reproductive: Normal uterus and ovaries.

Musculoskeletal. No bony spinal canal stenosis or focal osseous
abnormality.

Other: None.

Review of the MIP images confirms the above findings.
IMPRESSION: 1. No acute aortic syndrome.
2. Moderate gallbladder wall thickening versus large noncalcified
gallstone. Right upper quadrant ultrasound is recommended.
3. Very mild calcific aortic atherosclerosis ([id number]).

## 2021-03-17 ENCOUNTER — Encounter (INDEPENDENT_AMBULATORY_CARE_PROVIDER_SITE_OTHER): Payer: Self-pay | Admitting: *Deleted

## 2021-08-29 ENCOUNTER — Encounter (INDEPENDENT_AMBULATORY_CARE_PROVIDER_SITE_OTHER): Payer: Self-pay | Admitting: *Deleted

## 2022-11-08 ENCOUNTER — Ambulatory Visit
Admission: EM | Admit: 2022-11-08 | Discharge: 2022-11-08 | Disposition: A | Discharge status: Home / Self Care | Payer: BC Managed Care – PPO

## 2022-11-08 DIAGNOSIS — J029 Acute pharyngitis, unspecified: Secondary | ICD-10-CM | POA: Diagnosis not present

## 2022-11-08 DIAGNOSIS — J309 Allergic rhinitis, unspecified: Secondary | ICD-10-CM | POA: Diagnosis not present

## 2022-11-08 HISTORY — DX: Essential (primary) hypertension: I10

## 2022-11-08 LAB — POCT RAPID STREP A (OFFICE): Rapid Strep A Screen: NEGATIVE

## 2022-11-08 MED ORDER — PSEUDOEPH-BROMPHEN-DM 30-2-10 MG/5ML PO SYRP: 5.0000 mL | ORAL_SOLUTION | Freq: Four times a day (QID) | ORAL | 0 refills | Status: AC | PRN

## 2022-11-08 MED ORDER — FLUTICASONE PROPIONATE 50 MCG/ACT NA SUSP: 2.0000 | Freq: Every day | NASAL | 0 refills | Status: AC

## 2022-11-08 MED ORDER — CETIRIZINE HCL 10 MG PO TABS: 10.0000 mg | ORAL_TABLET | Freq: Every day | ORAL | 0 refills | Status: AC

## 2022-11-08 NOTE — Discharge Instructions (Addendum)
The rapid strep test was negative.  A throat culture is pending.  You will be contacted if the pending test results are positive to provide treatment. Take medication as prescribed.  You may stop the DayQuil and NyQuil at this time while taking this new medication. Increase fluids and allow for plenty of rest. Warm salt water gargles 3-4 times daily while symptoms persist. Recommend a soft diet while throat pain persist.  This includes soup, broth, yogurt, pudding, or Jell-O. As discussed, if symptoms do not improve within the next 7 to 10 days, or suddenly worsen before that time, please follow-up in this clinic or with your primary care physician for further evaluation. Follow-up as needed.

## 2022-11-08 NOTE — ED Provider Notes (Signed)
[facility]    CSN: [id number] Arrival date & time: 11/08/22  1004      History   Chief Complaint Chief Complaint  Patient presents with   Sore Throat    HPI Amy Dickerson is a 52 y.o. female.   The history is provided by the patient.   Patient presents for complaints of sore throat and nasal congestion.  Patient states nasal congestion with mild cough has been present for the past 2 weeks, she states sore throat is been present for the past 3 days.  She thinks she may have had a fever since symptoms started.  She did not denies chills, body aches, headache, ear pain, wheezing, shortness of breath, difficulty breathing, or GI symptoms.  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  She would like to get tested today.  Past Medical History:  Diagnosis Date   Family history of adverse reaction to anesthesia    Mother PONV   History of kidney stones    Hypertension     Patient Active Problem List   Diagnosis Date Noted   Symptomatic cholelithiasis     Past Surgical History:  Procedure Laterality Date   CESAREAN SECTION     x 2   CHOLECYSTECTOMY N/A 08/27/2017   Procedure: LAPAROSCOPIC CHOLECYSTECTOMY;  Surgeon: Virl Cagey, MD;  Location: [facility];  Service: General;  Laterality: N/A;   ENDOMETRIAL ABLATION W/ NOVASURE      OB History   No obstetric history on file.      Home Medications    Prior to Admission medications   Medication Sig Start Date End Date Taking? Authorizing Provider  brompheniramine-pseudoephedrine-DM 30-2-10 MG/5ML syrup Take 5 mLs by mouth 4 (four) times daily as needed. 11/08/22  Yes Manar Smalling-Warren, Alda Lea, NP  cetirizine (ZYRTEC) 10 MG tablet Take 1 tablet (10 mg total) by mouth daily. 11/08/22  Yes Kalisa Girtman-Warren, Alda Lea, NP  DM-Phenylephrine-Acetaminophen (VICKS DAYQUIL COLD & FLU PO) Take by mouth.   Yes [provider]  fluticasone (FLONASE) 50 MCG/ACT nasal  spray Place 2 sprays into both nostrils daily. 11/08/22  Yes Daily Doe-Warren, Alda Lea, NP  Multiple Vitamin (MULTIVITAMIN) capsule Take 1 capsule by mouth daily.   Yes [provider]  rosuvastatin (CRESTOR) 20 MG tablet Take 1 tablet by mouth every evening. 03/09/22  Yes [provider]  cholecalciferol (VITAMIN D) 1000 units tablet Take 1,000 Units by mouth daily.    [provider]  ondansetron (ZOFRAN ODT) 4 MG disintegrating tablet Take 1 tablet (4 mg total) by mouth every 8 (eight) hours as needed for nausea or vomiting. 07/17/17   Ezequiel Essex, MD    Family History Family History  Problem Relation Age of Onset   Heart disease Mother    Pulmonary fibrosis Mother    Stroke Father    Multiple myeloma Father     Social History Social History   Tobacco Use   Smoking status: Never   Smokeless tobacco: Never  Vaping Use   Vaping Use: Never used  Substance Use Topics   Alcohol use: Yes    Comment: occasionally   Drug use: No     Allergies   Patient has no known allergies.   Review of Systems Review of Systems Per HPI  Physical Exam Triage Vital Signs ED Triage Vitals  Enc Vitals Group     BP 11/08/22 1023 (!) 141/96     Pulse Rate  11/08/22 1023 (!) 117     Resp 11/08/22 1023 16     Temp 11/08/22 1027 99.3 F (37.4 C)     Temp Source 11/08/22 1023 Oral     SpO2 11/08/22 1023 93 %     Weight --      Height --      Head Circumference --      Peak Flow --      Pain Score 11/08/22 1025 6     Pain Loc --      Pain Edu? --      Excl. in [place]? --    No data found.  Updated Vital Signs BP (!) 141/96 (BP Location: Right Arm)   Pulse (!) 117   Temp 99.3 F (37.4 C) (Oral)   Resp 16   SpO2 93%   Visual Acuity Right Eye Distance:   Left Eye Distance:   Bilateral Distance:    Right Eye Near:   Left Eye Near:    Bilateral Near:     Physical Exam Vitals and nursing note reviewed.  Constitutional:      General: She is not in  acute distress.    Appearance: She is well-developed.  HENT:     Head: Normocephalic.     Right Ear: Tympanic membrane and ear canal normal.     Left Ear: Tympanic membrane and ear canal normal.     Nose: Congestion present. No rhinorrhea.     Mouth/Throat:     Mouth: Mucous membranes are moist.     Pharynx: Posterior oropharyngeal erythema present. No pharyngeal swelling, oropharyngeal exudate or uvula swelling.     Tonsils: No tonsillar exudate.     Comments: Cobblestoning present on posterior oropharynx Eyes:     Conjunctiva/sclera: Conjunctivae normal.     Pupils: Pupils are equal, round, and reactive to light.  Cardiovascular:     Rate and Rhythm: Normal rate and regular rhythm.     Heart sounds: Normal heart sounds.  Pulmonary:     Effort: Pulmonary effort is normal. No respiratory distress.     Breath sounds: Normal breath sounds. No stridor. No wheezing, rhonchi or rales.  Abdominal:     General: Bowel sounds are normal.     Palpations: Abdomen is soft.  Genitourinary:    Vagina: Normal. No vaginal discharge.  Musculoskeletal:     Cervical back: Normal range of motion.  Lymphadenopathy:     Cervical: No cervical adenopathy.  Skin:    General: Skin is warm and dry.  Neurological:     General: No focal deficit present.     Mental Status: She is alert and oriented to person, place, and time.     Cranial Nerves: No cranial nerve deficit.  Psychiatric:        Mood and Affect: Mood normal.        Behavior: Behavior normal.      UC Treatments / Results  Labs (all labs ordered are listed, but only abnormal results are displayed) Labs Reviewed  POCT RAPID STREP A (OFFICE)    EKG   Radiology No results found.  Procedures Procedures (including critical care time)  Medications Ordered in UC Medications - No data to display  Initial Impression / Assessment and Plan / UC Course  I have reviewed the triage vital signs and the nursing notes.  Pertinent labs &  imaging results that were available during my care of the patient were reviewed by me and considered in my medical decision  making (see chart for details).  The patient is well-appearing, she is in no acute distress, vital signs are stable.  Rapid strep test is negative, no indication to perform additional viral testing at this time.  Throat culture has been ordered.  Suspect symptoms may be likely related to allergic rhinitis given the duration, and presence of cobblestoning on her oropharynx.  Will treat symptomatically with cetirizine 10 mg as an antihistamine, Bromfed-DM to help with her cough and to work as an antihistamine, and fluticasone 50 micro nasal spray for nasal congestion.  Supportive care recommendations were provided to the patient along with indications of when follow-up may be necessary.  Patient is in agreement with this plan of care and verbalizes understanding.  All questions were answered.  Patient stable for discharge.   Final Clinical Impressions(s) / UC Diagnoses   Final diagnoses:  Sore throat  Allergic rhinitis, unspecified seasonality, unspecified trigger     Discharge Instructions      The rapid strep test was negative.  A throat culture is pending.  You will be contacted if the pending test results are positive to provide treatment. Take medication as prescribed.  You may stop the DayQuil and NyQuil at this time while taking this new medication. Increase fluids and allow for plenty of rest. Warm salt water gargles 3-4 times daily while symptoms persist. Recommend a soft diet while throat pain persist.  This includes soup, broth, yogurt, pudding, or Jell-O. As discussed, if symptoms do not improve within the next 7 to 10 days, or suddenly worsen before that time, please follow-up in this clinic or with your primary care physician for further evaluation. Follow-up as needed.     ED Prescriptions     Medication Sig Dispense Auth. Provider    brompheniramine-pseudoephedrine-DM 30-2-10 MG/5ML syrup Take 5 mLs by mouth 4 (four) times daily as needed. 140 mL Fronia Depass-Warren, Alda Lea, NP   fluticasone (FLONASE) 50 MCG/ACT nasal spray Place 2 sprays into both nostrils daily. 16 g Michel Eskelson-Warren, Alda Lea, NP   cetirizine (ZYRTEC) 10 MG tablet Take 1 tablet (10 mg total) by mouth daily. 30 tablet Brysten Reister-Warren, Alda Lea, NP      PDMP not reviewed this encounter.   Tish Men, NP 11/08/22 1054

## 2022-11-08 NOTE — ED Triage Notes (Signed)
Pt reports sore throat x 3 days; nasal congestion x 2 weeks. Dayquil and Nyquil gives some relief.   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

## 2023-02-17 ENCOUNTER — Other Ambulatory Visit (HOSPITAL_BASED_OUTPATIENT_CLINIC_OR_DEPARTMENT_OTHER): Payer: Self-pay | Admitting: Obstetrics and Gynecology

## 2023-02-17 DIAGNOSIS — Z8249 Family history of ischemic heart disease and other diseases of the circulatory system: Secondary | ICD-10-CM

## 2023-04-15 ENCOUNTER — Ambulatory Visit (HOSPITAL_COMMUNITY): Payer: BC Managed Care – PPO

## 2023-05-03 ENCOUNTER — Ambulatory Visit (HOSPITAL_COMMUNITY)
Admission: RE | Admit: 2023-05-03 | Discharge: 2023-05-03 | Disposition: A | Discharge status: Home / Self Care | Payer: BC Managed Care – PPO | Source: Ambulatory Visit | Attending: Obstetrics and Gynecology | Admitting: Obstetrics and Gynecology

## 2023-05-03 DIAGNOSIS — Z8249 Family history of ischemic heart disease and other diseases of the circulatory system: Secondary | ICD-10-CM | POA: Insufficient documentation
